# Patient Record
Sex: Female | Born: 1950 | Race: White | Hispanic: No | Marital: Married | Smoking: Former smoker
Health system: Southern US, Community
[De-identification: ages and names within clinical notes are randomized; demographics above are authoritative.]

## PROBLEM LIST (undated history)

## (undated) DIAGNOSIS — R51 Headache: Secondary | ICD-10-CM

## (undated) DIAGNOSIS — M5136 Other intervertebral disc degeneration, lumbar region: Secondary | ICD-10-CM

## (undated) DIAGNOSIS — Z8711 Personal history of peptic ulcer disease: Secondary | ICD-10-CM

## (undated) DIAGNOSIS — T4145XA Adverse effect of unspecified anesthetic, initial encounter: Secondary | ICD-10-CM

## (undated) DIAGNOSIS — B029 Zoster without complications: Secondary | ICD-10-CM

## (undated) DIAGNOSIS — J45909 Unspecified asthma, uncomplicated: Secondary | ICD-10-CM

## (undated) DIAGNOSIS — Z8719 Personal history of other diseases of the digestive system: Secondary | ICD-10-CM

## (undated) DIAGNOSIS — I8393 Asymptomatic varicose veins of bilateral lower extremities: Secondary | ICD-10-CM

## (undated) DIAGNOSIS — Z87442 Personal history of urinary calculi: Secondary | ICD-10-CM

## (undated) DIAGNOSIS — T8859XA Other complications of anesthesia, initial encounter: Secondary | ICD-10-CM

## (undated) DIAGNOSIS — Z9889 Other specified postprocedural states: Secondary | ICD-10-CM

## (undated) DIAGNOSIS — M51369 Other intervertebral disc degeneration, lumbar region without mention of lumbar back pain or lower extremity pain: Secondary | ICD-10-CM

## (undated) DIAGNOSIS — R519 Headache, unspecified: Secondary | ICD-10-CM

## (undated) DIAGNOSIS — M069 Rheumatoid arthritis, unspecified: Secondary | ICD-10-CM

## (undated) DIAGNOSIS — Z973 Presence of spectacles and contact lenses: Secondary | ICD-10-CM

## (undated) DIAGNOSIS — M25461 Effusion, right knee: Secondary | ICD-10-CM

## (undated) DIAGNOSIS — M797 Fibromyalgia: Secondary | ICD-10-CM

## (undated) DIAGNOSIS — K219 Gastro-esophageal reflux disease without esophagitis: Secondary | ICD-10-CM

## (undated) DIAGNOSIS — K5730 Diverticulosis of large intestine without perforation or abscess without bleeding: Secondary | ICD-10-CM

## (undated) DIAGNOSIS — M1990 Unspecified osteoarthritis, unspecified site: Secondary | ICD-10-CM

## (undated) DIAGNOSIS — S83207A Unspecified tear of unspecified meniscus, current injury, left knee, initial encounter: Secondary | ICD-10-CM

## (undated) DIAGNOSIS — I10 Essential (primary) hypertension: Secondary | ICD-10-CM

## (undated) DIAGNOSIS — B059 Measles without complication: Secondary | ICD-10-CM

## (undated) DIAGNOSIS — R112 Nausea with vomiting, unspecified: Secondary | ICD-10-CM

## (undated) DIAGNOSIS — B269 Mumps without complication: Secondary | ICD-10-CM

## (undated) HISTORY — PX: SHOULDER OPEN ROTATOR CUFF REPAIR: SHX2407

## (undated) HISTORY — PX: TARSAL TUNNEL RELEASE: SUR1099

## (undated) HISTORY — PX: MENISCUS REPAIR: SHX5179

---

## 1974-06-28 HISTORY — PX: APPENDECTOMY: SHX54

## 1981-06-28 HISTORY — PX: VAGINAL HYSTERECTOMY: SUR661

## 1999-06-26 ENCOUNTER — Ambulatory Visit (HOSPITAL_COMMUNITY): Admission: RE | Admit: 1999-06-26 | Discharge: 1999-06-26 | Payer: Self-pay | Admitting: *Deleted

## 1999-06-26 ENCOUNTER — Encounter: Payer: Self-pay | Admitting: *Deleted

## 1999-07-17 ENCOUNTER — Ambulatory Visit (HOSPITAL_COMMUNITY): Admission: RE | Admit: 1999-07-17 | Discharge: 1999-07-17 | Payer: Self-pay | Admitting: *Deleted

## 1999-10-12 ENCOUNTER — Ambulatory Visit (HOSPITAL_COMMUNITY): Admission: RE | Admit: 1999-10-12 | Discharge: 1999-10-12 | Payer: Self-pay | Admitting: Orthopedic Surgery

## 1999-10-12 ENCOUNTER — Encounter: Payer: Self-pay | Admitting: Orthopedic Surgery

## 1999-10-23 ENCOUNTER — Encounter: Payer: Self-pay | Admitting: Orthopedic Surgery

## 1999-10-23 ENCOUNTER — Ambulatory Visit (HOSPITAL_COMMUNITY): Admission: RE | Admit: 1999-10-23 | Discharge: 1999-10-23 | Payer: Self-pay | Admitting: Orthopedic Surgery

## 1999-11-06 ENCOUNTER — Encounter: Payer: Self-pay | Admitting: Orthopedic Surgery

## 1999-11-06 ENCOUNTER — Ambulatory Visit (HOSPITAL_COMMUNITY): Admission: RE | Admit: 1999-11-06 | Discharge: 1999-11-06 | Payer: Self-pay | Admitting: Orthopedic Surgery

## 2003-10-28 ENCOUNTER — Encounter: Admission: RE | Admit: 2003-10-28 | Discharge: 2003-12-09 | Payer: Self-pay | Admitting: Family Medicine

## 2009-11-05 ENCOUNTER — Encounter: Admission: RE | Admit: 2009-11-05 | Discharge: 2009-11-05 | Payer: Self-pay | Admitting: Family Medicine

## 2009-11-06 ENCOUNTER — Encounter: Admission: RE | Admit: 2009-11-06 | Discharge: 2010-02-04 | Payer: Self-pay | Admitting: Podiatry

## 2010-02-11 ENCOUNTER — Encounter: Admission: RE | Admit: 2010-02-11 | Discharge: 2010-02-11 | Payer: Self-pay | Admitting: Neurosurgery

## 2010-07-19 ENCOUNTER — Encounter: Payer: Self-pay | Admitting: Family Medicine

## 2011-03-29 ENCOUNTER — Other Ambulatory Visit: Payer: Self-pay | Admitting: Family Medicine

## 2011-03-29 DIAGNOSIS — Z1231 Encounter for screening mammogram for malignant neoplasm of breast: Secondary | ICD-10-CM

## 2011-04-30 ENCOUNTER — Ambulatory Visit: Payer: Self-pay

## 2013-07-12 ENCOUNTER — Other Ambulatory Visit: Payer: Self-pay | Admitting: Rheumatology

## 2013-07-12 DIAGNOSIS — M25561 Pain in right knee: Secondary | ICD-10-CM

## 2013-07-14 ENCOUNTER — Ambulatory Visit
Admission: RE | Admit: 2013-07-14 | Discharge: 2013-07-14 | Disposition: A | Discharge status: Home / Self Care | Payer: BC Managed Care – PPO | Source: Ambulatory Visit | Attending: Rheumatology | Admitting: Rheumatology

## 2013-07-14 DIAGNOSIS — M25561 Pain in right knee: Secondary | ICD-10-CM

## 2013-07-16 ENCOUNTER — Ambulatory Visit
Admission: RE | Admit: 2013-07-16 | Discharge: 2013-07-16 | Disposition: A | Discharge status: Home / Self Care | Payer: BC Managed Care – PPO | Source: Ambulatory Visit | Attending: Family Medicine | Admitting: Family Medicine

## 2013-07-16 ENCOUNTER — Other Ambulatory Visit: Payer: Self-pay | Admitting: Family Medicine

## 2013-07-16 DIAGNOSIS — N200 Calculus of kidney: Secondary | ICD-10-CM

## 2013-07-16 DIAGNOSIS — M545 Low back pain, unspecified: Secondary | ICD-10-CM

## 2013-08-31 ENCOUNTER — Other Ambulatory Visit: Payer: Self-pay | Admitting: Orthopedic Surgery

## 2013-08-31 NOTE — H&P (Signed)
[name] is an 63 y.o. female.   Chief Complaint: R knee pain HPI: The patient reports right knee symptoms including: pain and giving way (popping and grinding) which began 1 year(s) ago without any known injury. The patient describes the severity of the symptoms as moderate in severity.The patient feels that the symptoms are unchanging. Prior to being seen today the patient was previously evaluated by a primary care provider (Dr. Nickola Wells). Previous work-up for this problem has included knee MRI. Past treatment for this problem has included application of heat, intra-articular injection of corticosteroids (few days of relief), nonsteroidal anti-inflammatory drugs (Voltaren Gel), non-opioid analgesics (Tramadol) and opioid analgesics (Hydrocodone). Symptoms are reported to be located in the right knee and include knee pain, swelling, locking and instability. The patient does not report any radiation of symptoms. The patient describes their pain as dull and aching. Onset of symptoms was gradual. Symptoms are exacerbated by motion at the knee (flexion), weight bearing, kneeling and squatting. Pertinent medical history includes rheumatoid arthritis. [name] presents with R knee pain, over a year duration, no injury. Her left knee is also slightly painful but not as severe and not with mechanical symptoms. She had relief with a steroid injection in the left knee but in the right it only helped a few days. She reports continued swelling, instability, catching and locking in the right knee. She also had an IM steroid injection with no relief. All the above medications give only temporary relief. She sometimes wakes up with pain at night when turning over in bed.   No past medical history on file.  No past surgical history on file.  No family history on file. Social History:  has no tobacco, alcohol, and drug history on file.  Allergies: Allergies not on file   (Not in a hospital admission)  No results  found for this or any previous visit (from the past 48 hour(s)). No results found.  Review of Systems  Constitutional: Negative.   HENT: Negative.   Eyes: Negative.   Respiratory: Negative.   Cardiovascular: Negative.   Genitourinary: Negative.   Musculoskeletal: Positive for back pain and joint pain.  Skin: Negative.   Neurological: Positive for sensory change and focal weakness.  Psychiatric/Behavioral: Negative.     There were no vitals taken for this visit. Physical Exam  Constitutional: She is oriented to person, place, and time. She appears well-developed and well-nourished.  HENT:  Head: Normocephalic and atraumatic.  Eyes: Conjunctivae and EOM are normal. Pupils are equal, round, and reactive to light.  Neck: Normal range of motion. Neck supple.  Cardiovascular: Normal rate and regular rhythm.   Respiratory: Effort normal and breath sounds normal.  GI: Soft. Bowel sounds are normal.  Musculoskeletal:  General Mental Status - Alert. General Appearance- pleasant and In acute distress (mild). appears uncomfortable (seated, leaning right). Orientation- Oriented X3. Build & Nutrition- Well nourished and Well developed. Gait- Antalgic.  Peripheral Vascular Lower Extremity: Palpation:Homan's sign- Bilateral- Negative (normal). Posterior tibial pulse- Bilateral- 2+. Dorsalis pedis pulse- Bilateral- 2+.  Musculoskeletal Right Knee: Inspection and Palpation:Tenderness- medial joint line tender to palpation and lateral joint line tender to palpation. no tenderness to palpation of the posterior knee, no tenderness to palpation of the quadriceps tendon, no tenderness to palpation of the patellar tendon, no tenderness to palpation of the fibular head and no tenderness to palpation of the peroneal nerve. Patellar Tendon- no pain to palpation of the patellar tendon. Swelling- periarticular swelling present. Effusion- trace. Tissue tension/texture  is - soft. Crepitus-  mild patellofemoral crepitus. Pulses- 2+. Sensation- intact to light touch. Skin- Color- no ecchymosis and no erythema. Strength and Tone:Quadriceps- 5/5. Hamstrings- 5/5. ROM:- Full ROM of the knee. Stability- Valgus Laxity at 30- None. Valgus Laxity at 0- None. Varus Laxity at 30- None. Varus Laxity at 0- None. Lachman- Negative. Anterior Drawer Test - Negative. Posterior Drawer Test- Negative. Deformities/Malalignments/Discrepancies- no deformities noted. Special Tests:McMurray Test (lateral) - painful. McMurray Test (medial)- negative. Patellar Compression Pain- no pain.  Lymphatic General Lymphatics Description- No Localized lymphadenopathy.  Neurological: She is alert and oriented to person, place, and time. She has normal reflexes.  Skin: Skin is warm and dry.  Psychiatric: She has a normal mood and affect.    Knees with moderate medial joint space narrowing bilaterally. There is no collapse of the joint space.  R knee images and report from [facility] reviewed today by Dr. Shelle Iron with LMT at the junction of the anterior horn and body. Mild tricompartmental DJD. No OCD lesion or full thickness chondral lesions. Moderate effusion, possible loose body.  Assessment/Plan R knee LMT, DJD  Pt presents today with R knee pain, effusion, mechanical symptoms x 1 year without injury, refractory to steroid injections, PO and IM steroids, HEP, activity modifications, NSAIDs, pain medications, with LMT noted on MRI, underlying arthrosis without collapse of the joint space. We discussed relevant anatomy. Given her ongoing pain and mechanical symptoms which are refractory to above listed conservative tx, recommend arthroscopy right knee with partial lateral meniscectomy and debridement. We discussed the procedure itself as well as risks, complications and alternatives including but not limited to DVT, PE, infx, bleeding, failure of procedure, need for secondary  procedure, anesthesia risk, even death. Discussed typical post-op protocols. Discussed possible progression of arthritic symptoms and possible need for repeat steroid injections or even viscosupplementation. All her questions were answered and she desires to proceed. Will obtain pre-op clearance and proceed accordingly. In the interim, recommend activity modifications, quad strengthening. Will follow up for the knee 10-14 days post-op for suture removal.  I had a long discussion with the patient concerning the risks and benefits of knee arthroscopy including help from the arthroscopic procedure as well as no help from the arthroscopic procedure or worsening of symptoms. Also discussed infection, DVT, PE, anesthetic complications, etc. Also discussed the possibility of repeat arthroscopic surgery required in the future or total knee replacement. I provided the patient with an illustrated handout and discussed it in detail as well as discussed the postoperative and perioperative courses and return to functional activities including work. Need for postoperative DVT prophylaxis was discussed as well.  Plan right knee arthroscopy, partial lateral meniscectomy, debridement  Mertice Uffelman M. for Dr. Shelle Iron 08/31/2013, 3:17 PM

## 2013-09-04 ENCOUNTER — Encounter (HOSPITAL_BASED_OUTPATIENT_CLINIC_OR_DEPARTMENT_OTHER): Payer: Self-pay | Admitting: *Deleted

## 2013-09-05 ENCOUNTER — Encounter (HOSPITAL_BASED_OUTPATIENT_CLINIC_OR_DEPARTMENT_OTHER): Payer: Self-pay | Admitting: *Deleted

## 2013-09-05 NOTE — Progress Notes (Addendum)
NPO AFTER MN WITH EXCEPTION CLEAR LIQUIDS UNTIL 0800 (NO CREAM/ MILK PRODUCTS).  ARRIVE AT 1215. NEEDS ISTAT AND EKG. WILL TAKE NORVASC, TYLENOL, FOLIC ACID, PREDNISONE, AND  PLAQUENIL AM DOS W/ SIPS OF WATER.

## 2013-09-07 ENCOUNTER — Ambulatory Visit (HOSPITAL_BASED_OUTPATIENT_CLINIC_OR_DEPARTMENT_OTHER)
Admission: RE | Admit: 2013-09-07 | Discharge: 2013-09-07 | Disposition: A | Discharge status: Home / Self Care | Payer: BC Managed Care – PPO | Source: Ambulatory Visit | Attending: Specialist | Admitting: Specialist

## 2013-09-07 ENCOUNTER — Encounter (HOSPITAL_BASED_OUTPATIENT_CLINIC_OR_DEPARTMENT_OTHER): Payer: Self-pay

## 2013-09-07 ENCOUNTER — Encounter (HOSPITAL_BASED_OUTPATIENT_CLINIC_OR_DEPARTMENT_OTHER)
Admission: RE | Disposition: A | Discharge status: Home / Self Care | Payer: Self-pay | Source: Ambulatory Visit | Attending: Specialist

## 2013-09-07 ENCOUNTER — Ambulatory Visit (HOSPITAL_BASED_OUTPATIENT_CLINIC_OR_DEPARTMENT_OTHER): Payer: BC Managed Care – PPO | Admitting: Anesthesiology

## 2013-09-07 ENCOUNTER — Encounter (HOSPITAL_BASED_OUTPATIENT_CLINIC_OR_DEPARTMENT_OTHER): Payer: BC Managed Care – PPO | Admitting: Anesthesiology

## 2013-09-07 DIAGNOSIS — S83289A Other tear of lateral meniscus, current injury, unspecified knee, initial encounter: Secondary | ICD-10-CM | POA: Insufficient documentation

## 2013-09-07 DIAGNOSIS — M1710 Unilateral primary osteoarthritis, unspecified knee: Secondary | ICD-10-CM | POA: Insufficient documentation

## 2013-09-07 DIAGNOSIS — S83249A Other tear of medial meniscus, current injury, unspecified knee, initial encounter: Secondary | ICD-10-CM

## 2013-09-07 DIAGNOSIS — I4510 Unspecified right bundle-branch block: Secondary | ICD-10-CM | POA: Insufficient documentation

## 2013-09-07 DIAGNOSIS — M069 Rheumatoid arthritis, unspecified: Secondary | ICD-10-CM | POA: Insufficient documentation

## 2013-09-07 DIAGNOSIS — M2240 Chondromalacia patellae, unspecified knee: Secondary | ICD-10-CM | POA: Insufficient documentation

## 2013-09-07 DIAGNOSIS — Z87891 Personal history of nicotine dependence: Secondary | ICD-10-CM | POA: Insufficient documentation

## 2013-09-07 DIAGNOSIS — I10 Essential (primary) hypertension: Secondary | ICD-10-CM | POA: Insufficient documentation

## 2013-09-07 DIAGNOSIS — J45909 Unspecified asthma, uncomplicated: Secondary | ICD-10-CM | POA: Insufficient documentation

## 2013-09-07 DIAGNOSIS — M25469 Effusion, unspecified knee: Secondary | ICD-10-CM | POA: Insufficient documentation

## 2013-09-07 DIAGNOSIS — IMO0002 Reserved for concepts with insufficient information to code with codable children: Secondary | ICD-10-CM | POA: Insufficient documentation

## 2013-09-07 DIAGNOSIS — X58XXXA Exposure to other specified factors, initial encounter: Secondary | ICD-10-CM | POA: Insufficient documentation

## 2013-09-07 HISTORY — DX: Personal history of peptic ulcer disease: Z87.11

## 2013-09-07 HISTORY — DX: Unspecified osteoarthritis, unspecified site: M19.90

## 2013-09-07 HISTORY — DX: Rheumatoid arthritis, unspecified: M06.9

## 2013-09-07 HISTORY — DX: Essential (primary) hypertension: I10

## 2013-09-07 HISTORY — DX: Presence of spectacles and contact lenses: Z97.3

## 2013-09-07 HISTORY — DX: Personal history of other diseases of the digestive system: Z87.19

## 2013-09-07 HISTORY — PX: KNEE ARTHROSCOPY WITH LATERAL MENISECTOMY: SHX6193

## 2013-09-07 HISTORY — DX: Diverticulosis of large intestine without perforation or abscess without bleeding: K57.30

## 2013-09-07 HISTORY — DX: Unspecified asthma, uncomplicated: J45.909

## 2013-09-07 LAB — POCT I-STAT 4, (NA,K, GLUC, HGB,HCT)
Glucose, Bld: 81 mg/dL (ref 70–99)
HCT: 46 % (ref 36.0–46.0)
Hemoglobin: 15.6 g/dL — ABNORMAL HIGH (ref 12.0–15.0)
Potassium: 2.7 meq/L — CL (ref 3.7–5.3)
Sodium: 137 meq/L (ref 137–147)

## 2013-09-07 SURGERY — ARTHROSCOPY, KNEE, WITH LATERAL MENISCECTOMY
Anesthesia: General | Site: Knee | Laterality: Right

## 2013-09-07 MED ORDER — EPINEPHRINE HCL 1 MG/ML IJ SOLN
INTRAMUSCULAR | Status: DC | PRN
  Administered 2013-09-07: 14:00:00

## 2013-09-07 MED ORDER — CEFAZOLIN SODIUM-DEXTROSE 2-3 GM-% IV SOLR
2.0000 g | INTRAVENOUS | Status: AC
  Administered 2013-09-07: 2 g via INTRAVENOUS
  Filled 2013-09-07: qty 50

## 2013-09-07 MED ORDER — SODIUM CHLORIDE 0.9 % IR SOLN
Status: DC | PRN
  Administered 2013-09-07: 14:00:00

## 2013-09-07 MED ORDER — FENTANYL CITRATE 0.05 MG/ML IJ SOLN
25.0000 ug | INTRAMUSCULAR | Status: DC | PRN
  Administered 2013-09-07: 25 ug via INTRAVENOUS
  Filled 2013-09-07: qty 1

## 2013-09-07 MED ORDER — PROPOFOL 10 MG/ML IV BOLUS
INTRAVENOUS | Status: DC | PRN
  Administered 2013-09-07: 160 mg via INTRAVENOUS

## 2013-09-07 MED ORDER — LACTATED RINGERS IV SOLN
INTRAVENOUS | Status: DC
  Administered 2013-09-07 (×2): via INTRAVENOUS
  Filled 2013-09-07: qty 1000

## 2013-09-07 MED ORDER — KETOROLAC TROMETHAMINE 30 MG/ML IJ SOLN
INTRAMUSCULAR | Status: DC | PRN
  Administered 2013-09-07: 15 mg via INTRAVENOUS

## 2013-09-07 MED ORDER — FENTANYL CITRATE 0.05 MG/ML IJ SOLN
INTRAMUSCULAR | Status: AC
  Filled 2013-09-07: qty 4

## 2013-09-07 MED ORDER — ONDANSETRON HCL 4 MG/2ML IJ SOLN
INTRAMUSCULAR | Status: DC | PRN
  Administered 2013-09-07: 4 mg via INTRAVENOUS

## 2013-09-07 MED ORDER — LIDOCAINE HCL (CARDIAC) 20 MG/ML IV SOLN
INTRAVENOUS | Status: DC | PRN
  Administered 2013-09-07: 60 mg via INTRAVENOUS

## 2013-09-07 MED ORDER — MIDAZOLAM HCL 5 MG/5ML IJ SOLN
INTRAMUSCULAR | Status: DC | PRN
  Administered 2013-09-07: 2 mg via INTRAVENOUS

## 2013-09-07 MED ORDER — LACTATED RINGERS IV SOLN
INTRAVENOUS | Status: DC
  Filled 2013-09-07: qty 1000

## 2013-09-07 MED ORDER — FENTANYL CITRATE 0.05 MG/ML IJ SOLN
INTRAMUSCULAR | Status: AC
  Filled 2013-09-07: qty 2

## 2013-09-07 MED ORDER — DEXAMETHASONE SODIUM PHOSPHATE 4 MG/ML IJ SOLN
INTRAMUSCULAR | Status: DC | PRN
  Administered 2013-09-07: 10 mg via INTRAVENOUS

## 2013-09-07 MED ORDER — KETOROLAC TROMETHAMINE 10 MG PO TABS: 10.0000 mg | ORAL_TABLET | Freq: Four times a day (QID) | ORAL | Status: DC | PRN

## 2013-09-07 MED ORDER — MIDAZOLAM HCL 2 MG/2ML IJ SOLN
INTRAMUSCULAR | Status: AC
  Filled 2013-09-07: qty 2

## 2013-09-07 MED ORDER — TRIAMCINOLONE ACETONIDE 40 MG/ML IJ SUSP
INTRAMUSCULAR | Status: DC | PRN
  Administered 2013-09-07: 40 mg via INTRAMUSCULAR

## 2013-09-07 MED ORDER — BUPIVACAINE-EPINEPHRINE 0.5% -1:200000 IJ SOLN
INTRAMUSCULAR | Status: DC | PRN
  Administered 2013-09-07: 22 mL

## 2013-09-07 MED ORDER — FENTANYL CITRATE 0.05 MG/ML IJ SOLN
INTRAMUSCULAR | Status: DC | PRN
  Administered 2013-09-07 (×2): 25 ug via INTRAVENOUS
  Administered 2013-09-07: 50 ug via INTRAVENOUS

## 2013-09-07 SURGICAL SUPPLY — 51 items
BANDAGE ELASTIC 6 VELCRO ST LF (GAUZE/BANDAGES/DRESSINGS) ×3 IMPLANT
BLADE 4.2CUDA (BLADE) IMPLANT
BLADE CUDA SHAVER 3.5 (BLADE) ×3 IMPLANT
BLADE GREAT WHITE 4.2 (BLADE) IMPLANT
BLADE GREAT WHITE 4.2MM (BLADE)
BNDG COHESIVE 6X5 TAN NS LF (GAUZE/BANDAGES/DRESSINGS) ×3 IMPLANT
BOOTIES KNEE HIGH SLOAN (MISCELLANEOUS) ×3 IMPLANT
CANISTER SUCT LVC 12 LTR MEDI- (MISCELLANEOUS) ×3 IMPLANT
CANISTER SUCTION 1200CC (MISCELLANEOUS) IMPLANT
CANISTER SUCTION 2500CC (MISCELLANEOUS) IMPLANT
CANNULA ACUFLEX KIT 5X76 (CANNULA) IMPLANT
CLOTH BEACON ORANGE TIMEOUT ST (SAFETY) ×3 IMPLANT
CUTTER MENISCUS  4.2MM (BLADE)
CUTTER MENISCUS 4.2MM (BLADE) IMPLANT
DRAPE ARTHROSCOPY W/POUCH 114 (DRAPES) ×3 IMPLANT
DRSG EMULSION OIL 3X3 NADH (GAUZE/BANDAGES/DRESSINGS) ×3 IMPLANT
DURAPREP 26ML APPLICATOR (WOUND CARE) ×3 IMPLANT
ELECT REM PT RETURN 9FT ADLT (ELECTROSURGICAL)
ELECTRODE REM PT RTRN 9FT ADLT (ELECTROSURGICAL) IMPLANT
GLOVE BIOGEL M 6.5 STRL (GLOVE) ×3 IMPLANT
GLOVE BIOGEL PI IND STRL 6.5 (GLOVE) ×1 IMPLANT
GLOVE BIOGEL PI IND STRL 7.5 (GLOVE) ×1 IMPLANT
GLOVE BIOGEL PI INDICATOR 6.5 (GLOVE) ×2
GLOVE BIOGEL PI INDICATOR 7.5 (GLOVE) ×2
GLOVE SURG SS PI 8.0 STRL IVOR (GLOVE) ×3 IMPLANT
GOWN PREVENTION PLUS LG XLONG (DISPOSABLE) ×3 IMPLANT
GOWN STRL REIN XL XLG (GOWN DISPOSABLE) ×3 IMPLANT
GOWN STRL REUS W/TWL LRG LVL3 (GOWN DISPOSABLE) ×3 IMPLANT
GOWN STRL REUS W/TWL XL LVL3 (GOWN DISPOSABLE) ×3 IMPLANT
IV NS IRRIG 3000ML ARTHROMATIC (IV SOLUTION) ×6 IMPLANT
KNEE WRAP E Z 3 GEL PACK (MISCELLANEOUS) ×3 IMPLANT
MINI VAC (SURGICAL WAND) IMPLANT
NDL SAFETY ECLIPSE 18X1.5 (NEEDLE) ×1 IMPLANT
NEEDLE FILTER BLUNT 18X 1/2SAF (NEEDLE) ×2
NEEDLE FILTER BLUNT 18X1 1/2 (NEEDLE) ×1 IMPLANT
NEEDLE HYPO 18GX1.5 SHARP (NEEDLE) ×2
PACK ARTHROSCOPY DSU (CUSTOM PROCEDURE TRAY) ×3 IMPLANT
PACK BASIN DAY SURGERY FS (CUSTOM PROCEDURE TRAY) ×3 IMPLANT
PAD ABD 8X10 STRL (GAUZE/BANDAGES/DRESSINGS) ×3 IMPLANT
PADDING CAST COTTON 6X4 STRL (CAST SUPPLIES) ×3 IMPLANT
RESECTOR FULL RADIUS 4.2MM (BLADE) IMPLANT
SET ARTHROSCOPY TUBING (MISCELLANEOUS) ×2
SET ARTHROSCOPY TUBING LN (MISCELLANEOUS) ×1 IMPLANT
SPONGE GAUZE 4X4 12PLY (GAUZE/BANDAGES/DRESSINGS) ×3 IMPLANT
SPONGE GAUZE 4X4 12PLY STER LF (GAUZE/BANDAGES/DRESSINGS) ×3 IMPLANT
SUT ETHILON 4 0 PS 2 18 (SUTURE) ×3 IMPLANT
SYR 30ML LL (SYRINGE) ×3 IMPLANT
SYRINGE 10CC LL (SYRINGE) ×3 IMPLANT
TOWEL OR 17X24 6PK STRL BLUE (TOWEL DISPOSABLE) ×3 IMPLANT
WAND 90 DEG TURBOVAC W/CORD (SURGICAL WAND) IMPLANT
WATER STERILE IRR 500ML POUR (IV SOLUTION) ×3 IMPLANT

## 2013-09-07 NOTE — Transfer of Care (Signed)
Immediate Anesthesia Transfer of Care Note  Patient: [name]  Procedure(s) Performed: Procedure(s) (LRB): RIGHT KNEE ARTHROSCOPY WITH PARTIAL MEDIAL AND LATERAL MENISECTOMY/DEBRIDEMENT/  (Right)  Patient Location: PACU  Anesthesia Type: General  Level of Consciousness: awake, oriented, sedated and patient cooperative  Airway & Oxygen Therapy: Patient Spontanous Breathing and Patient connected to face mask oxygen  Post-op Assessment: Report given to PACU RN and Post -op Vital signs reviewed and stable  Post vital signs: Reviewed and stable  Complications: No apparent anesthesia complications

## 2013-09-07 NOTE — Anesthesia Postprocedure Evaluation (Signed)
  Anesthesia Post-op Note  Patient: [name]  Procedure(s) Performed: Procedure(s) (LRB): RIGHT KNEE ARTHROSCOPY WITH PARTIAL MEDIAL AND LATERAL MENISECTOMY/DEBRIDEMENT/  (Right)  Patient Location: PACU  Anesthesia Type: General  Level of Consciousness: awake and alert   Airway and Oxygen Therapy: Patient Spontanous Breathing  Post-op Pain: mild  Post-op Assessment: Post-op Vital signs reviewed, Patient's Cardiovascular Status Stable, Respiratory Function Stable, Patent Airway and No signs of Nausea or vomiting  Last Vitals:  Filed Vitals:   09/07/13 1202  BP: 151/89  Pulse: 85  Temp: 36.4 C  Resp: 16    Post-op Vital Signs: stable   Complications: No apparent anesthesia complications

## 2013-09-07 NOTE — Interval H&P Note (Signed)
History and Physical Interval Note:  09/07/2013 12:41 PM  [name]  has presented today for surgery, with the diagnosis of RIGHT KNEE LATERAL MENISCUS TEAR/DJD  The various methods of treatment have been discussed with the patient and family. After consideration of risks, benefits and other options for treatment, the patient has consented to  Procedure(s): RIGHT KNEE ARTHROSCOPY WITH PARTIAL LATERAL MENISECTOMY/DEBRIDEMENT (Right) as a surgical intervention .  The patient's history has been reviewed, patient examined, no change in status, stable for surgery.  I have reviewed the patient's chart and labs.  Questions were answered to the patient's satisfaction.     [name]

## 2013-09-07 NOTE — Discharge Instructions (Signed)
ARTHROSCOPIC KNEE SURGERY HOME CARE INSTRUCTIONS   PAIN You will be expected to have a moderate amount of pain in the affected knee for approximately two weeks.  However, the first two to four days will be the most severe in terms of the pain you will experience.  Prescriptions have been provided for you to take as needed for the pain.  The pain can be markedly reduced by using the ice/compressive bandage given.  Exchange the ice packs whenever they thaw.  During the night, keep the bandage on because it will still provide some compression for the swelling.  Also, keep the leg elevated on pillows above your heart, and this will help alleviate the pain and swelling.  MEDICATION Prescriptions have been provided to take as needed for pain. To prevent blood clots, take Aspirin 325mg  daily with a meal if not on a blood thinner and if no history of stomach ulcers.  ACTIVITY It is preferred that you stay on bedrest for approximately 24 hours.  However, you may go to the bathroom with help.  After this, you can start to be up and about progressively more.  Remember that the swelling may still increase after three to four days if you are up and doing too much.  You may put as much weight on the affected leg as pain will allow.  Use your crutches for comfort and safety.  However, as soon as you are able, you may discard the crutches and go without them.   DRESSING Keep the current dressing as dry as possible.  Two days after your surgery, you may remove the ice/compressive wrap, and surgical dressing.  You may now take a shower, but do not scrub the sounds directly with soap.  Let water rinse over these and gently wipe with your hand.  Reapply band-aids over the puncture wounds and more gauze if needed.  A slight amount of thin drainage can be normal at this time, and do not let it frighten you.  Reapply the ice/compressive wrap.  You may now repeat this every day each time you shower.  SYMPTOMS TO REPORT TO  YOUR DOCTOR  -Extreme pain.  -Extreme swelling.  -Temperature above 101 degrees that does not come down with acetaminophen     (Tylenol).  -Any changes in the feeling, color or movement of your toes.  -Extreme redness, heat, swelling or drainage at your incision  EXERCISE It is preferred that you begin to exercise on the day of your surgery.  Straight leg raises and short arc quads should be begun the afternoon or evening of surgery and continued until you come back for your follow-up appointment.   Attached is an instruction sheet on how to perform these two simple exercises.  Do these at least three times per day if not more.  You may bend your knee as much as is comfortable.  The puncture wounds may occasionally be slightly uncomfortable with bending of the knee.  Do not let this frighten you.  It is important to keep your knee motion, but do not overdo it.  If you have significant pain, simply do not bend the knee as far.   You will be given more exercises to perform at your first return visit.    RETURN APPOINTMENT Please make an appointment to be seen by your doctor in 10-14 days from your surgery.  Patient Signature:  ________________________________________________________  Nurse's Signature:  ________________________________________________________   Post Anesthesia Home Care Instructions  Activity: Get plenty of  rest for the remainder of the day. A responsible adult should stay with you for 24 hours following the procedure.  For the next 24 hours, DO NOT: -Drive a car -Advertising copywriter -Drink alcoholic beverages -Take any medication unless instructed by your physician -Make any legal decisions or sign important papers.  Meals: Start with liquid foods such as gelatin or soup. Progress to regular foods as tolerated. Avoid greasy, spicy, heavy foods. If nausea and/or vomiting occur, drink only clear liquids until the nausea and/or vomiting subsides. Call your physician if  vomiting continues.  Special Instructions/Symptoms: Your throat may feel dry or sore from the anesthesia or the breathing tube placed in your throat during surgery. If this causes discomfort, gargle with warm salt water. The discomfort should disappear within 24 hours.  Post Anesthesia Home Care Instructions  Activity: Get plenty of rest for the remainder of the day. A responsible adult should stay with you for 24 hours following the procedure.  For the next 24 hours, DO NOT: -Drive a car -Advertising copywriter -Drink alcoholic beverages -Take any medication unless instructed by your physician -Make any legal decisions or sign important papers.  Meals: Start with liquid foods such as gelatin or soup. Progress to regular foods as tolerated. Avoid greasy, spicy, heavy foods. If nausea and/or vomiting occur, drink only clear liquids until the nausea and/or vomiting subsides. Call your physician if vomiting continues.  Special Instructions/Symptoms: Your throat may feel dry or sore from the anesthesia or the breathing tube placed in your throat during surgery. If this causes discomfort, gargle with warm salt water. The discomfort should disappear within 24 hours.

## 2013-09-07 NOTE — Brief Op Note (Signed)
09/07/2013  1:39 PM  PATIENT:  [name]  63 y.o. female  PRE-OPERATIVE DIAGNOSIS:  RIGHT KNEE LATERAL MENISCUS TEAR/DJD  POST-OPERATIVE DIAGNOSIS:  RIGHT KNEE LATERAL MENISCUS TEAR/DJD  PROCEDURE:  Procedure(s): RIGHT KNEE ARTHROSCOPY WITH PARTIAL LATERAL MENISECTOMY/DEBRIDEMENT (Right)  SURGEON:  Surgeon(s) and Role:    * Javier Docker, MD - Primary  PHYSICIAN ASSISTANT:   ASSISTANTS: none   ANESTHESIA:   general  EBL:     BLOOD ADMINISTERED:none  DRAINS: none   LOCAL MEDICATIONS USED:  MARCAINE     SPECIMEN:  No Specimen  DISPOSITION OF SPECIMEN:  N/A  COUNTS:  YES  TOURNIQUET:  * No tourniquets in log *  DICTATION: .Other Dictation: Dictation Number [id number]  PLAN OF CARE: Discharge to home after PACU  PATIENT DISPOSITION:  PACU - hemodynamically stable.   Delay start of Pharmacological VTE agent (>24hrs) due to surgical blood loss or risk of bleeding: no

## 2013-09-07 NOTE — H&P (View-Only) (Signed)
[name] is an 63 y.o. female.   Chief Complaint: R knee pain HPI: The patient reports right knee symptoms including: pain and giving way (popping and grinding) which began 1 year(s) ago without any known injury. The patient describes the severity of the symptoms as moderate in severity.The patient feels that the symptoms are unchanging. Prior to being seen today the patient was previously evaluated by a primary care provider (Dr. Nickola Wells). Previous work-up for this problem has included knee MRI. Past treatment for this problem has included application of heat, intra-articular injection of corticosteroids (few days of relief), nonsteroidal anti-inflammatory drugs (Voltaren Gel), non-opioid analgesics (Tramadol) and opioid analgesics (Hydrocodone). Symptoms are reported to be located in the right knee and include knee pain, swelling, locking and instability. The patient does not report any radiation of symptoms. The patient describes their pain as dull and aching. Onset of symptoms was gradual. Symptoms are exacerbated by motion at the knee (flexion), weight bearing, kneeling and squatting. Pertinent medical history includes rheumatoid arthritis. [name] presents with R knee pain, over a year duration, no injury. Her left knee is also slightly painful but not as severe and not with mechanical symptoms. She had relief with a steroid injection in the left knee but in the right it only helped a few days. She reports continued swelling, instability, catching and locking in the right knee. She also had an IM steroid injection with no relief. All the above medications give only temporary relief. She sometimes wakes up with pain at night when turning over in bed.   No past medical history on file.  No past surgical history on file.  No family history on file. Social History:  has no tobacco, alcohol, and drug history on file.  Allergies: Allergies not on file   (Not in a hospital admission)  No results  found for this or any previous visit (from the past 48 hour(s)). No results found.  Review of Systems  Constitutional: Negative.   HENT: Negative.   Eyes: Negative.   Respiratory: Negative.   Cardiovascular: Negative.   Genitourinary: Negative.   Musculoskeletal: Positive for back pain and joint pain.  Skin: Negative.   Neurological: Positive for sensory change and focal weakness.  Psychiatric/Behavioral: Negative.     There were no vitals taken for this visit. Physical Exam  Constitutional: She is oriented to person, place, and time. She appears well-developed and well-nourished.  HENT:  Head: Normocephalic and atraumatic.  Eyes: Conjunctivae and EOM are normal. Pupils are equal, round, and reactive to light.  Neck: Normal range of motion. Neck supple.  Cardiovascular: Normal rate and regular rhythm.   Respiratory: Effort normal and breath sounds normal.  GI: Soft. Bowel sounds are normal.  Musculoskeletal:  General Mental Status - Alert. General Appearance- pleasant and In acute distress (mild). appears uncomfortable (seated, leaning right). Orientation- Oriented X3. Build & Nutrition- Well nourished and Well developed. Gait- Antalgic.  Peripheral Vascular Lower Extremity: Palpation:Homan's sign- Bilateral- Negative (normal). Posterior tibial pulse- Bilateral- 2+. Dorsalis pedis pulse- Bilateral- 2+.  Musculoskeletal Right Knee: Inspection and Palpation:Tenderness- medial joint line tender to palpation and lateral joint line tender to palpation. no tenderness to palpation of the posterior knee, no tenderness to palpation of the quadriceps tendon, no tenderness to palpation of the patellar tendon, no tenderness to palpation of the fibular head and no tenderness to palpation of the peroneal nerve. Patellar Tendon- no pain to palpation of the patellar tendon. Swelling- periarticular swelling present. Effusion- trace. Tissue tension/texture  is - soft. Crepitus-  mild patellofemoral crepitus. Pulses- 2+. Sensation- intact to light touch. Skin- Color- no ecchymosis and no erythema. Strength and Tone:Quadriceps- 5/5. Hamstrings- 5/5. ROM:- Full ROM of the knee. Stability- Valgus Laxity at 30- None. Valgus Laxity at 0- None. Varus Laxity at 30- None. Varus Laxity at 0- None. Lachman- Negative. Anterior Drawer Test - Negative. Posterior Drawer Test- Negative. Deformities/Malalignments/Discrepancies- no deformities noted. Special Tests:McMurray Test (lateral) - painful. McMurray Test (medial)- negative. Patellar Compression Pain- no pain.  Lymphatic General Lymphatics Description- No Localized lymphadenopathy.  Neurological: She is alert and oriented to person, place, and time. She has normal reflexes.  Skin: Skin is warm and dry.  Psychiatric: She has a normal mood and affect.    Knees with moderate medial joint space narrowing bilaterally. There is no collapse of the joint space.  R knee images and report from [facility] reviewed today by Dr. Beane with LMT at the junction of the anterior horn and body. Mild tricompartmental DJD. No OCD lesion or full thickness chondral lesions. Moderate effusion, possible loose body.  Assessment/Plan R knee LMT, DJD  Pt presents today with R knee pain, effusion, mechanical symptoms x 1 year without injury, refractory to steroid injections, PO and IM steroids, HEP, activity modifications, NSAIDs, pain medications, with LMT noted on MRI, underlying arthrosis without collapse of the joint space. We discussed relevant anatomy. Given her ongoing pain and mechanical symptoms which are refractory to above listed conservative tx, recommend arthroscopy right knee with partial lateral meniscectomy and debridement. We discussed the procedure itself as well as risks, complications and alternatives including but not limited to DVT, PE, infx, bleeding, failure of procedure, need for secondary  procedure, anesthesia risk, even death. Discussed typical post-op protocols. Discussed possible progression of arthritic symptoms and possible need for repeat steroid injections or even viscosupplementation. All her questions were answered and she desires to proceed. Will obtain pre-op clearance and proceed accordingly. In the interim, recommend activity modifications, quad strengthening. Will follow up for the knee 10-14 days post-op for suture removal.  I had a long discussion with the patient concerning the risks and benefits of knee arthroscopy including help from the arthroscopic procedure as well as no help from the arthroscopic procedure or worsening of symptoms. Also discussed infection, DVT, PE, anesthetic complications, etc. Also discussed the possibility of repeat arthroscopic surgery required in the future or total knee replacement. I provided the patient with an illustrated handout and discussed it in detail as well as discussed the postoperative and perioperative courses and return to functional activities including work. Need for postoperative DVT prophylaxis was discussed as well.  Plan right knee arthroscopy, partial lateral meniscectomy, debridement  BISSELL, JACLYN M. for Dr. Beane 08/31/2013, 3:17 PM    

## 2013-09-07 NOTE — Progress Notes (Signed)
Reported Potassium level to 2.7 to Dr. Leta Jungling, no new orders.

## 2013-09-07 NOTE — Anesthesia Procedure Notes (Signed)
Procedure Name: LMA Insertion Date/Time: 09/07/2013 1:54 PM Performed by: Renella Cunas D Pre-anesthesia Checklist: Patient identified, Emergency Drugs available, Suction available and Patient being monitored Patient Re-evaluated:Patient Re-evaluated prior to inductionOxygen Delivery Method: Circle System Utilized Preoxygenation: Pre-oxygenation with 100% oxygen Intubation Type: IV induction Ventilation: Mask ventilation without difficulty LMA: LMA inserted LMA Size: 4.0 Number of attempts: 1 Airway Equipment and Method: bite block Placement Confirmation: positive ETCO2 Tube secured with: Tape Dental Injury: Teeth and Oropharynx as per pre-operative assessment

## 2013-09-07 NOTE — Anesthesia Preprocedure Evaluation (Signed)
Anesthesia Evaluation  Patient identified by MRN, date of birth, ID band Patient awake    Reviewed: Allergy & Precautions, H&P , NPO status , Patient's Chart, lab work & pertinent test results, reviewed documented beta blocker date and time   Airway Mallampati: II TM Distance: >3 FB Neck ROM: full    Dental no notable dental hx. (+) Teeth Intact, Dental Advisory Given   Pulmonary asthma , former smoker,  Asthma due to environmental allergies breath sounds clear to auscultation  Pulmonary exam normal       Cardiovascular Exercise Tolerance: Good hypertension, Pt. on medications Rhythm:regular Rate:Normal  RBBB   Neuro/Psych negative neurological ROS  negative psych ROS   GI/Hepatic negative GI ROS, Neg liver ROS,   Endo/Other  negative endocrine ROS  Renal/GU negative Renal ROS  negative genitourinary   Musculoskeletal  (+) Arthritis -, Rheumatoid disorders,    Abdominal   Peds  Hematology negative hematology ROS (+)   Anesthesia Other Findings   Reproductive/Obstetrics negative OB ROS                           Anesthesia Physical Anesthesia Plan  ASA: III  Anesthesia Plan: General   Post-op Pain Management:    Induction: Intravenous  Airway Management Planned: LMA  Additional Equipment:   Intra-op Plan:   Post-operative Plan:   Informed Consent: I have reviewed the patients History and Physical, chart, labs and discussed the procedure including the risks, benefits and alternatives for the proposed anesthesia with the patient or authorized representative who has indicated his/her understanding and acceptance.   Dental Advisory Given  Plan Discussed with: CRNA and Surgeon  Anesthesia Plan Comments:         Anesthesia Quick Evaluation

## 2013-09-10 ENCOUNTER — Encounter (HOSPITAL_BASED_OUTPATIENT_CLINIC_OR_DEPARTMENT_OTHER): Payer: Self-pay | Admitting: Specialist

## 2013-09-10 NOTE — Op Note (Signed)
NAME:  [name], [name]                ACCOUNT NO.:  [id number]  MEDICAL RECORD NO.:  [id number]  LOCATION:                                 FACILITY:  PHYSICIAN:  Jene Every, M.D.    DATE OF BIRTH:  December 16, 1950  DATE OF PROCEDURE:  09/07/2013 DATE OF DISCHARGE:                              OPERATIVE REPORT   PREOPERATIVE DIAGNOSIS: 1. Lateral meniscus tear, degenerative joint disease. 2. Degenerative joint disease of the left knee.  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  PROCEDURE PERFORMED: 1. Right knee arthroscopy with partial, medial, and lateral     meniscectomies. 2. Extensive debridement of the chondroplasty of the patella, medial     femoral condyle, tibial plateau, and lateral femoral condyle. 3. Intra-articular corticosteroid injection of the left knee as well.  ANESTHESIA:  General.  ASSISTANT:  None.  BRIEF HISTORY:  This is a 63 year old with bilateral knee pain, right greater than left, locking, popping, and giving way.  MRI indicating lateral meniscus tear indicated for partial lateral meniscectomy.  DJD of the left knee, asked for an intra-articular corticosteroid injection to the left knee.  Risk and benefits discussed including bleeding, infection, damage to neurovascular structures, no change.  Worsening of symptoms, need for repeat debridement, etc.  TECHNIQUE:  The patient in supine position, after the induction of adequate general anesthesia, 2 g Kefzol, right lower extremity was prepped and draped in usual sterile fashion.  The left knee was prepped and draped in usual sterile fashion as well and we injected 40 mg Depo- Medrol through a medial portal under sterile conditions and placed a bandage over that.  The right lower extremity was prepped and draped in usual sterile fashion.  A lateral parapatellar  portal was fashioned with a #11 blade.  Ingress cannula atraumatically placed.  Irrigant was utilized to insufflate the joint.  Under direct visualization, a medial parapatellar portal was fashioned with a #11 blade after localization with 18-gauge needle sparing the medial meniscus.  Noted was a radial tear of the posterior third of medial meniscus.  I introduced a basket and resected it to a stable base.  They were further contoured with a Cuda shaver 3.5 Cauda shaver, grade 2 changes of femoral condyle were noted and light chondroplasty performed here.  There was some interstitial tearing of the ACL.  Extensive tearing of the lateral compartment meniscus, extending anteriorly to middle to posteriorly.  We introduced a shaver and debrided to a stable base to perform partial medial meniscectomy and also used straight baskets. This is shaved to a stable base.  The remnant was stable to probe palpation as it was immediately.  Chondroplasties were performed of the femoral condyle, tibial plateau, near grade 4 changes to the posterolateral tibial plateau.  No grade 4 changes of the femoral condyle, but extensive grade 3 changes.  Suprapatellar pouch revealed extensive grade 3 changes of the patella. Normal patellofemoral tracking.  Sulcus grade 3 changes, light chondroplasty performed of the patella and of the sulcus.  Gutters were unremarkable.  Revisit all compartments.  There was  loose cartilaginous debris that was evacuated as well.  No further pathology amenable to arthroscopic intervention.  I, therefore, removed all instrumentation. Portals were closed with 4-0 nylon simple sutures.  A 0.25% Marcaine with epinephrine was infiltrated in the joint.  Wound was dressed sterilely.  Awoken without difficulty and transported to the recovery room in satisfactory condition.  The patient tolerated the procedure well.  No complications.  Minimal blood loss.     Jene Every,  M.D.     Cordelia Pen  D:  09/07/2013  T:  09/08/2013  Job:  [id number]

## 2013-11-20 ENCOUNTER — Other Ambulatory Visit: Payer: Self-pay | Admitting: Orthopedic Surgery

## 2013-11-29 ENCOUNTER — Encounter (HOSPITAL_BASED_OUTPATIENT_CLINIC_OR_DEPARTMENT_OTHER): Payer: Self-pay | Admitting: *Deleted

## 2013-12-03 ENCOUNTER — Other Ambulatory Visit: Payer: Self-pay | Admitting: Orthopedic Surgery

## 2013-12-03 ENCOUNTER — Encounter (HOSPITAL_BASED_OUTPATIENT_CLINIC_OR_DEPARTMENT_OTHER): Payer: Self-pay | Admitting: *Deleted

## 2013-12-03 NOTE — Progress Notes (Signed)
NPO AFTER MN WITH EXCEPTION CLEAR LIQUIDS UNTIL 0700 (NO CREAM/ MILK PRODUCTS).  ARRIVE AT 1100.  NEEDS ISTAT. CURRENT EKG IN CHART AND EPIC. WILL TAKE NORVASC, TRAMADOL, TYLENOL, PLAQUENIL, AND FOLIC ACID AM DOS W/ SIPS OF WATER.

## 2013-12-03 NOTE — H&P (Signed)
[name] is an 63 y.o. female.   Chief Complaint: left knee pain, instability HPI: The patient is a 63 year old female who presents today for follow up of their knee. The patient is being followed for their left knee pain. Symptoms reported today include: pain and swelling, instability. Current treatment includes: activity modification and pain medications. The following medication has been used for pain control: Ultram. Refractory to NSAIDs, steroid injections, Euflexxa, pain medications, bracing  Past Medical History  Diagnosis Date  . Diverticulosis of sigmoid colon   . Hypertension   . Asthma due to environmental allergies   . History of gastric ulcer   . RA (rheumatoid arthritis)   . Arthritis   . Wears glasses   . Acute meniscal tear of left knee     Past Surgical History  Procedure Laterality Date  . Appendectomy  1976  . Vaginal hysterectomy  1983  . Shoulder open rotator cuff repair Bilateral right  1993/   left  1998  . Tarsal tunnel release Bilateral right 2010/   left 2009    feet  . Knee arthroscopy with lateral menisectomy Right 09/07/2013    Procedure: RIGHT KNEE ARTHROSCOPY WITH PARTIAL MEDIAL AND LATERAL MENISECTOMY/DEBRIDEMENT/ ;  Surgeon: Jeffrey C Beane, MD;  Location: [facility];  Service: Orthopedics;  Laterality: Right;    No family history on file. Social History:  reports that she quit smoking about 14 years ago. Her smoking use included Cigarettes. She has a 15 pack-year smoking history. She has never used smokeless tobacco. She reports that she does not drink alcohol or use illicit drugs.  Allergies:  Allergies  Allergen Reactions  . Asa [Aspirin] Other (See Comments)    Avoids asa 325mg, causes right side facial numbness and gi upset/   Pt can take asa 81mg with no issues  . Oxycodone Other (See Comments)    Caused sores/lesions in nose   . Sulfa Antibiotics Hives     (Not in a hospital admission)  No results found for  this or any previous visit (from the past 48 hour(s)). No results found.  Review of Systems  Constitutional: Negative.   HENT: Negative.   Eyes: Negative.   Respiratory: Negative.   Cardiovascular: Negative.   Gastrointestinal: Negative.   Genitourinary: Negative.   Musculoskeletal: Positive for joint pain.  Skin: Negative.   Neurological: Negative.   Psychiatric/Behavioral: Negative.     There were no vitals taken for this visit. Physical Exam  Constitutional: She is oriented to person, place, and time. She appears well-developed and well-nourished.  HENT:  Head: Normocephalic and atraumatic.  Eyes: Conjunctivae and EOM are normal. Pupils are equal, round, and reactive to light.  Neck: Normal range of motion. Neck supple.  Cardiovascular: Normal rate and regular rhythm.   Respiratory: Effort normal and breath sounds normal.  GI: Soft. Bowel sounds are normal.  Musculoskeletal:  On exam of the knee she is tender over the medial joint line. There is a positive McMurray. Moderate effusion. Restricted range. No evidence of, ecchymosis, deformity or erythema. No patellofemoral pain with compression. Nontender over the fibular head or the peroneal nerve. Nontender over the quadriceps insertion of the patellar ligament insertion. Provocative maneuvers revealed a negative Lachman, negative anterior and posterior drawer. No instability was noted with varus and valgus stressing at 0 or 30 degrees. On manual motor test the quadriceps and hamstrings were 5/5. Sensory exam was intact to light touch.  Neurological: She is alert and   oriented to person, place, and time. She has normal reflexes.  Skin: Skin is warm and dry.  Psychiatric: She has a normal mood and affect.   MRI of the knee demonstrates a medial meniscus tear, complex, posterior horn. Tricompartmental osteoarthrosis, diffuse, mild to moderate. There is a small Baker's cyst.  Assessment/Plan Left knee MMT, DJD Symptomatic  medial meniscus tear with underlying osteoarthritis and mechanical symptoms.  Proceed with a knee arthroscopy, procedure discussed.  I had a long discussion with the patient concerning the risks and benefits of knee arthroscopy including help from the arthroscopic procedure as well as no help from the arthroscopic procedure or worsening of symptoms. Also discussed infection, DVT, PE, anesthetic complications, etc. Also discussed the possibility of repeat arthroscopic surgery required in the future or total knee replacement. I provided the patient with an illustrated handout and discussed it in detail as well as discussed the postoperative and perioperative courses and return to functional activities including work. Need for postoperative DVT prophylaxis was discussed as well.  Left knee arthroscopy, debridement  Dayna Barker. Bissell PA-C for Dr. Shelle Iron 12/03/2013, 9:34 AM

## 2013-12-07 ENCOUNTER — Ambulatory Visit (HOSPITAL_BASED_OUTPATIENT_CLINIC_OR_DEPARTMENT_OTHER): Payer: BC Managed Care – PPO | Admitting: Anesthesiology

## 2013-12-07 ENCOUNTER — Encounter (HOSPITAL_BASED_OUTPATIENT_CLINIC_OR_DEPARTMENT_OTHER): Payer: Self-pay

## 2013-12-07 ENCOUNTER — Encounter (HOSPITAL_BASED_OUTPATIENT_CLINIC_OR_DEPARTMENT_OTHER)
Admission: RE | Disposition: A | Discharge status: Home / Self Care | Payer: Self-pay | Source: Ambulatory Visit | Attending: Specialist

## 2013-12-07 ENCOUNTER — Encounter (HOSPITAL_BASED_OUTPATIENT_CLINIC_OR_DEPARTMENT_OTHER): Payer: BC Managed Care – PPO | Admitting: Anesthesiology

## 2013-12-07 ENCOUNTER — Ambulatory Visit (HOSPITAL_BASED_OUTPATIENT_CLINIC_OR_DEPARTMENT_OTHER)
Admission: RE | Admit: 2013-12-07 | Discharge: 2013-12-07 | Disposition: A | Discharge status: Home / Self Care | Payer: BC Managed Care – PPO | Source: Ambulatory Visit | Attending: Specialist | Admitting: Specialist

## 2013-12-07 DIAGNOSIS — K219 Gastro-esophageal reflux disease without esophagitis: Secondary | ICD-10-CM | POA: Insufficient documentation

## 2013-12-07 DIAGNOSIS — M2240 Chondromalacia patellae, unspecified knee: Secondary | ICD-10-CM | POA: Insufficient documentation

## 2013-12-07 DIAGNOSIS — Z87891 Personal history of nicotine dependence: Secondary | ICD-10-CM | POA: Insufficient documentation

## 2013-12-07 DIAGNOSIS — J45909 Unspecified asthma, uncomplicated: Secondary | ICD-10-CM | POA: Insufficient documentation

## 2013-12-07 DIAGNOSIS — M069 Rheumatoid arthritis, unspecified: Secondary | ICD-10-CM | POA: Insufficient documentation

## 2013-12-07 DIAGNOSIS — M1712 Unilateral primary osteoarthritis, left knee: Secondary | ICD-10-CM

## 2013-12-07 DIAGNOSIS — M23329 Other meniscus derangements, posterior horn of medial meniscus, unspecified knee: Secondary | ICD-10-CM | POA: Insufficient documentation

## 2013-12-07 DIAGNOSIS — M1710 Unilateral primary osteoarthritis, unspecified knee: Secondary | ICD-10-CM | POA: Insufficient documentation

## 2013-12-07 DIAGNOSIS — I10 Essential (primary) hypertension: Secondary | ICD-10-CM | POA: Insufficient documentation

## 2013-12-07 HISTORY — DX: Unspecified tear of unspecified meniscus, current injury, left knee, initial encounter: S83.207A

## 2013-12-07 HISTORY — PX: KNEE ARTHROSCOPY: SHX127

## 2013-12-07 HISTORY — DX: Effusion, right knee: M25.461

## 2013-12-07 LAB — POCT I-STAT 4, (NA,K, GLUC, HGB,HCT)
Glucose, Bld: 65 mg/dL — ABNORMAL LOW (ref 70–99)
HCT: 35 % — ABNORMAL LOW (ref 36.0–46.0)
Hemoglobin: 11.9 g/dL — ABNORMAL LOW (ref 12.0–15.0)
Potassium: 3.3 mEq/L — ABNORMAL LOW (ref 3.7–5.3)
SODIUM: 137 meq/L (ref 137–147)

## 2013-12-07 SURGERY — ARTHROSCOPY, KNEE
Anesthesia: General | Site: Knee | Laterality: Left

## 2013-12-07 MED ORDER — KETOROLAC TROMETHAMINE 30 MG/ML IJ SOLN
INTRAMUSCULAR | Status: DC | PRN
  Administered 2013-12-07: 15 mg via INTRAVENOUS

## 2013-12-07 MED ORDER — FENTANYL CITRATE 0.05 MG/ML IJ SOLN
INTRAMUSCULAR | Status: DC | PRN
  Administered 2013-12-07 (×2): 50 ug via INTRAVENOUS

## 2013-12-07 MED ORDER — LIDOCAINE HCL (CARDIAC) 20 MG/ML IV SOLN
INTRAVENOUS | Status: DC | PRN
  Administered 2013-12-07: 80 mg via INTRAVENOUS

## 2013-12-07 MED ORDER — BUPIVACAINE-EPINEPHRINE 0.5% -1:200000 IJ SOLN
INTRAMUSCULAR | Status: DC | PRN
  Administered 2013-12-07: 20 mL

## 2013-12-07 MED ORDER — LACTATED RINGERS IV SOLN
INTRAVENOUS | Status: DC
  Administered 2013-12-07: 12:00:00 via INTRAVENOUS
  Filled 2013-12-07: qty 1000

## 2013-12-07 MED ORDER — TRAMADOL HCL 50 MG PO TABS: 50.0000 mg | ORAL_TABLET | Freq: Four times a day (QID) | ORAL | Status: DC | PRN

## 2013-12-07 MED ORDER — FENTANYL CITRATE 0.05 MG/ML IJ SOLN
INTRAMUSCULAR | Status: AC
  Filled 2013-12-07: qty 4

## 2013-12-07 MED ORDER — CEFAZOLIN SODIUM-DEXTROSE 2-3 GM-% IV SOLR
2.0000 g | INTRAVENOUS | Status: DC
Start: 2013-12-07 — End: 2013-12-07
  Filled 2013-12-07: qty 50

## 2013-12-07 MED ORDER — PROPOFOL 10 MG/ML IV BOLUS
INTRAVENOUS | Status: DC | PRN
  Administered 2013-12-07: 170 mg via INTRAVENOUS

## 2013-12-07 MED ORDER — KETOROLAC TROMETHAMINE 10 MG PO TABS: 10.0000 mg | ORAL_TABLET | Freq: Four times a day (QID) | ORAL | Status: DC | PRN

## 2013-12-07 MED ORDER — MIDAZOLAM HCL 5 MG/5ML IJ SOLN
INTRAMUSCULAR | Status: DC | PRN
  Administered 2013-12-07: 2 mg via INTRAVENOUS

## 2013-12-07 MED ORDER — MIDAZOLAM HCL 2 MG/2ML IJ SOLN
INTRAMUSCULAR | Status: AC
Start: 2013-12-07 — End: 2013-12-07
  Filled 2013-12-07: qty 2

## 2013-12-07 MED ORDER — FENTANYL CITRATE 0.05 MG/ML IJ SOLN
INTRAMUSCULAR | Status: AC
  Filled 2013-12-07: qty 2

## 2013-12-07 MED ORDER — LACTATED RINGERS IV SOLN
INTRAVENOUS | Status: DC
  Filled 2013-12-07: qty 1000

## 2013-12-07 MED ORDER — MIDAZOLAM HCL 2 MG/2ML IJ SOLN
INTRAMUSCULAR | Status: AC
  Filled 2013-12-07: qty 2

## 2013-12-07 MED ORDER — FENTANYL CITRATE 0.05 MG/ML IJ SOLN
25.0000 ug | INTRAMUSCULAR | Status: DC | PRN
  Filled 2013-12-07: qty 1

## 2013-12-07 MED ORDER — DEXAMETHASONE SODIUM PHOSPHATE 4 MG/ML IJ SOLN
INTRAMUSCULAR | Status: DC | PRN
  Administered 2013-12-07: 10 mg via INTRAVENOUS

## 2013-12-07 MED ORDER — ONDANSETRON HCL 4 MG/2ML IJ SOLN
INTRAMUSCULAR | Status: DC | PRN
  Administered 2013-12-07: 4 mg via INTRAVENOUS

## 2013-12-07 MED ORDER — EPINEPHRINE HCL 1 MG/ML IJ SOLN
INTRAMUSCULAR | Status: DC | PRN
  Administered 2013-12-07: 1 mg

## 2013-12-07 MED ORDER — SODIUM CHLORIDE 0.9 % IR SOLN
Status: DC | PRN
  Administered 2013-12-07: 6000 mL

## 2013-12-07 MED ORDER — PROMETHAZINE HCL 25 MG/ML IJ SOLN
6.2500 mg | INTRAMUSCULAR | Status: DC | PRN
  Filled 2013-12-07: qty 1

## 2013-12-07 SURGICAL SUPPLY — 48 items
BANDAGE ELASTIC 6 VELCRO ST LF (GAUZE/BANDAGES/DRESSINGS) ×3 IMPLANT
BANDAGE GAUZE ELAST BULKY 4 IN (GAUZE/BANDAGES/DRESSINGS) ×3 IMPLANT
BLADE 4.2CUDA (BLADE) IMPLANT
BLADE CUDA SHAVER 3.5 (BLADE) ×3 IMPLANT
BLADE GREAT WHITE 4.2 (BLADE) IMPLANT
BLADE GREAT WHITE 4.2MM (BLADE)
BNDG COHESIVE 6X5 TAN NS LF (GAUZE/BANDAGES/DRESSINGS) ×3 IMPLANT
BOOTIES KNEE HIGH SLOAN (MISCELLANEOUS) ×3 IMPLANT
CANISTER SUCT LVC 12 LTR MEDI- (MISCELLANEOUS) ×3 IMPLANT
CANISTER SUCTION 1200CC (MISCELLANEOUS) IMPLANT
CANISTER SUCTION 2500CC (MISCELLANEOUS) IMPLANT
CANNULA ACUFLEX KIT 5X76 (CANNULA) IMPLANT
CLOTH BEACON ORANGE TIMEOUT ST (SAFETY) ×3 IMPLANT
CUTTER MENISCUS  4.2MM (BLADE)
CUTTER MENISCUS 4.2MM (BLADE) IMPLANT
DRAPE ARTHROSCOPY W/POUCH 114 (DRAPES) ×3 IMPLANT
DURAPREP 26ML APPLICATOR (WOUND CARE) ×3 IMPLANT
ELECT REM PT RETURN 9FT ADLT (ELECTROSURGICAL)
ELECTRODE REM PT RTRN 9FT ADLT (ELECTROSURGICAL) IMPLANT
GAUZE XEROFORM 1X8 LF (GAUZE/BANDAGES/DRESSINGS) ×3 IMPLANT
GLOVE SURG SS PI 8.0 STRL IVOR (GLOVE) ×3 IMPLANT
GOWN PREVENTION PLUS LG XLONG (DISPOSABLE) IMPLANT
GOWN STRL REIN XL XLG (GOWN DISPOSABLE) IMPLANT
GOWN STRL REUS W/ TWL LRG LVL3 (GOWN DISPOSABLE) ×1 IMPLANT
GOWN STRL REUS W/ TWL XL LVL3 (GOWN DISPOSABLE) ×1 IMPLANT
GOWN STRL REUS W/TWL LRG LVL3 (GOWN DISPOSABLE) ×2
GOWN STRL REUS W/TWL XL LVL3 (GOWN DISPOSABLE) ×2
IV NS IRRIG 3000ML ARTHROMATIC (IV SOLUTION) ×6 IMPLANT
KNEE WRAP E Z 3 GEL PACK (MISCELLANEOUS) ×3 IMPLANT
MINI VAC (SURGICAL WAND) IMPLANT
NDL SAFETY ECLIPSE 18X1.5 (NEEDLE) ×1 IMPLANT
NEEDLE FILTER BLUNT 18X 1/2SAF (NEEDLE) ×2
NEEDLE FILTER BLUNT 18X1 1/2 (NEEDLE) ×1 IMPLANT
NEEDLE HYPO 18GX1.5 SHARP (NEEDLE) ×2
PACK ARTHROSCOPY DSU (CUSTOM PROCEDURE TRAY) ×3 IMPLANT
PACK BASIN DAY SURGERY FS (CUSTOM PROCEDURE TRAY) ×3 IMPLANT
PADDING CAST COTTON 6X4 STRL (CAST SUPPLIES) ×3 IMPLANT
RESECTOR FULL RADIUS 4.2MM (BLADE) IMPLANT
SET ARTHROSCOPY TUBING (MISCELLANEOUS) ×2
SET ARTHROSCOPY TUBING LN (MISCELLANEOUS) ×1 IMPLANT
SPONGE GAUZE 4X4 12PLY (GAUZE/BANDAGES/DRESSINGS) ×3 IMPLANT
SPONGE GAUZE 4X4 12PLY STER LF (GAUZE/BANDAGES/DRESSINGS) ×3 IMPLANT
SUT ETHILON 4 0 PS 2 18 (SUTURE) ×3 IMPLANT
SYR 30ML LL (SYRINGE) ×3 IMPLANT
SYRINGE 10CC LL (SYRINGE) ×3 IMPLANT
TOWEL OR 17X24 6PK STRL BLUE (TOWEL DISPOSABLE) ×3 IMPLANT
WAND 90 DEG TURBOVAC W/CORD (SURGICAL WAND) IMPLANT
WATER STERILE IRR 500ML POUR (IV SOLUTION) ×3 IMPLANT

## 2013-12-07 NOTE — Interval H&P Note (Signed)
History and Physical Interval Note:  12/07/2013 1:13 PM  [name]  has presented today for surgery, with the diagnosis of medial meniscal tear left knee  The various methods of treatment have been discussed with the patient and family. After consideration of risks, benefits and other options for treatment, the patient has consented to  Procedure(s): ARTHROSCOPY LEFT KNEE WITH DEBRIDEMENT (Left) as a surgical intervention .  The patient's history has been reviewed, patient examined, no change in status, stable for surgery.  I have reviewed the patient's chart and labs.  Questions were answered to the patient's satisfaction.     [name]

## 2013-12-07 NOTE — Anesthesia Procedure Notes (Signed)
Procedure Name: LMA Insertion Date/Time: 12/07/2013 1:20 PM Performed by: Maris Berger T Pre-anesthesia Checklist: Patient identified, Emergency Drugs available, Suction available and Patient being monitored Patient Re-evaluated:Patient Re-evaluated prior to inductionOxygen Delivery Method: Circle System Utilized Preoxygenation: Pre-oxygenation with 100% oxygen Intubation Type: IV induction Ventilation: Mask ventilation without difficulty LMA: LMA inserted LMA Size: 4.0 Number of attempts: 1 Airway Equipment and Method: bite block Placement Confirmation: positive ETCO2 Dental Injury: Teeth and Oropharynx as per pre-operative assessment

## 2013-12-07 NOTE — Transfer of Care (Signed)
Immediate Anesthesia Transfer of Care Note  Patient: [name]  Procedure(s) Performed: Procedure(s): ARTHROSCOPY LEFT KNEE WITH DEBRIDEMENT (Left)  Patient Location: PACU  Anesthesia Type:General  Level of Consciousness: awake  Airway & Oxygen Therapy: breathing spont, o2 per [facility]   Post-op Assessment: Report given to PACU RN  Post vital signs: Reviewed and stable  Complications: No apparent anesthesia complications

## 2013-12-07 NOTE — Brief Op Note (Signed)
12/07/2013  1:14 PM  PATIENT:  [name]  63 y.o. female  PRE-OPERATIVE DIAGNOSIS:  medial meniscal tear left knee  POST-OPERATIVE DIAGNOSIS:  * No post-op diagnosis entered *  PROCEDURE:  Procedure(s): ARTHROSCOPY LEFT KNEE WITH DEBRIDEMENT (Left)  SURGEON:  Surgeon(s) and Role:    * Javier Docker, MD - Primary  PHYSICIAN ASSISTANT:   ASSISTANTS: none   ANESTHESIA:   general  EBL:     BLOOD ADMINISTERED:none  DRAINS: none   LOCAL MEDICATIONS USED:  MARCAINE     SPECIMEN:  No Specimen  DISPOSITION OF SPECIMEN:  N/A  COUNTS:  YES  TOURNIQUET:  * No tourniquets in log *  DICTATION: .Other Dictation: Dictation Number [id number]  PLAN OF CARE: Discharge to home after PACU  PATIENT DISPOSITION:  PACU - hemodynamically stable.   Delay start of Pharmacological VTE agent (>24hrs) due to surgical blood loss or risk of bleeding: no

## 2013-12-07 NOTE — Anesthesia Preprocedure Evaluation (Signed)
Anesthesia Evaluation  Patient identified by MRN, date of birth, ID band Patient awake    Reviewed: Allergy & Precautions, H&P , NPO status , Patient's Chart, lab work & pertinent test results  Airway Mallampati: II TM Distance: >3 FB Neck ROM: Full    Dental no notable dental hx.    Pulmonary asthma , former smoker,  breath sounds clear to auscultation  Pulmonary exam normal       Cardiovascular hypertension, Pt. on medications Rhythm:Regular Rate:Normal     Neuro/Psych negative neurological ROS  negative psych ROS   GI/Hepatic Neg liver ROS, GERD-  Medicated,  Endo/Other  negative endocrine ROS  Renal/GU negative Renal ROS  negative genitourinary   Musculoskeletal negative musculoskeletal ROS (+) Arthritis -, Rheumatoid disorders,    Abdominal   Peds negative pediatric ROS (+)  Hematology negative hematology ROS (+)   Anesthesia Other Findings   Reproductive/Obstetrics negative OB ROS                           Anesthesia Physical Anesthesia Plan  ASA: III  Anesthesia Plan: General   Post-op Pain Management:    Induction: Intravenous  Airway Management Planned: LMA  Additional Equipment:   Intra-op Plan:   Post-operative Plan: Extubation in OR  Informed Consent: I have reviewed the patients History and Physical, chart, labs and discussed the procedure including the risks, benefits and alternatives for the proposed anesthesia with the patient or authorized representative who has indicated his/her understanding and acceptance.   Dental advisory given  Plan Discussed with: CRNA and Surgeon  Anesthesia Plan Comments:         Anesthesia Quick Evaluation

## 2013-12-07 NOTE — Discharge Instructions (Signed)
ARTHROSCOPIC KNEE SURGERY HOME CARE INSTRUCTIONS   PAIN You will be expected to have a moderate amount of pain in the affected knee for approximately two weeks.  However, the first two to four days will be the most severe in terms of the pain you will experience.  Prescriptions have been provided for you to take as needed for the pain.  The pain can be markedly reduced by using the ice/compressive bandage given.  Exchange the ice packs whenever they thaw.  During the night, keep the bandage on because it will still provide some compression for the swelling.  Also, keep the leg elevated on pillows above your heart, and this will help alleviate the pain and swelling.  MEDICATION Prescriptions have been provided to take as needed for pain. To prevent blood clots, take Aspirin 325mg  daily with a meal if not on a blood thinner and if no history of stomach ulcers.  ACTIVITY It is preferred that you stay on bedrest for approximately 24 hours.  However, you may go to the bathroom with help.  After this, you can start to be up and about progressively more.  Remember that the swelling may still increase after three to four days if you are up and doing too much.  You may put as much weight on the affected leg as pain will allow.  Use your crutches for comfort and safety.  However, as soon as you are able, you may discard the crutches and go without them.   DRESSING Keep the current dressing as dry as possible.  Two days after your surgery, you may remove the ice/compressive wrap, and surgical dressing.  You may now take a shower, but do not scrub the sounds directly with soap.  Let water rinse over these and gently wipe with your hand.  Reapply band-aids over the puncture wounds and more gauze if needed.  A slight amount of thin drainage can be normal at this time, and do not let it frighten you.  Reapply the ice/compressive wrap.  You may now repeat this every day each time you shower.  SYMPTOMS TO REPORT TO  YOUR DOCTOR  -Extreme pain.  -Extreme swelling.  -Temperature above 101 degrees that does not come down with acetaminophen     (Tylenol).  -Any changes in the feeling, color or movement of your toes.  -Extreme redness, heat, swelling or drainage at your incision  EXERCISE It is preferred that you begin to exercise on the day of your surgery.  Straight leg raises and short arc quads should be begun the afternoon or evening of surgery and continued until you come back for your follow-up appointment.   Attached is an instruction sheet on how to perform these two simple exercises.  Do these at least three times per day if not more.  You may bend your knee as much as is comfortable.  The puncture wounds may occasionally be slightly uncomfortable with bending of the knee.  Do not let this frighten you.  It is important to keep your knee motion, but do not overdo it.  If you have significant pain, simply do not bend the knee as far.   You will be given more exercises to perform at your first return visit.    RETURN APPOINTMENT Please make an appointment to be seen by your doctor in 14 days from your surgery.  Patient Signature:  ________________________________________________ Post Anesthesia Home Care Instructions  Activity: Get plenty of rest for the remainder of the day.  A responsible adult should stay with you for 24 hours following the procedure.  For the next 24 hours, DO NOT: -Drive a car -Advertising copywriter -Drink alcoholic beverages -Take any medication unless instructed by your physician -Make any legal decisions or sign important papers.  Meals: Start with liquid foods such as gelatin or soup. Progress to regular foods as tolerated. Avoid greasy, spicy, heavy foods. If nausea and/or vomiting occur, drink only clear liquids until the nausea and/or vomiting subsides. Call your physician if vomiting continues.  Special Instructions/Symptoms: Your throat may feel dry or sore from the  anesthesia or the breathing tube placed in your throat during surgery. If this causes discomfort, gargle with warm salt water. The discomfort should disappear within 24 hours. ________  Nurse's Signature:  ________________________________________________________

## 2013-12-07 NOTE — H&P (View-Only) (Signed)
[name] is an 63 y.o. female.   Chief Complaint: left knee pain, instability HPI: The patient is a 63 year old female who presents today for follow up of their knee. The patient is being followed for their left knee pain. Symptoms reported today include: pain and swelling, instability. Current treatment includes: activity modification and pain medications. The following medication has been used for pain control: Ultram. Refractory to NSAIDs, steroid injections, Euflexxa, pain medications, bracing  Past Medical History  Diagnosis Date  . Diverticulosis of sigmoid colon   . Hypertension   . Asthma due to environmental allergies   . History of gastric ulcer   . RA (rheumatoid arthritis)   . Arthritis   . Wears glasses   . Acute meniscal tear of left knee     Past Surgical History  Procedure Laterality Date  . Appendectomy  1976  . Vaginal hysterectomy  1983  . Shoulder open rotator cuff repair Bilateral right  1993/   left  1998  . Tarsal tunnel release Bilateral right 2010/   left 2009    feet  . Knee arthroscopy with lateral menisectomy Right 09/07/2013    Procedure: RIGHT KNEE ARTHROSCOPY WITH PARTIAL MEDIAL AND LATERAL MENISECTOMY/DEBRIDEMENT/ ;  Surgeon: Javier Docker, MD;  Location:  SUR[facility]ERY C[facility];  Service: Orthopedics;  Laterality: Right;    No family history on file. Social History:  reports that she quit smoking about 14 years ago. Her smoking use included Cigarettes. She has a 15 pack-year smoking history. She has never used smokeless tobacco. She reports that she does not drink alcohol or use illicit drugs.  Allergies:  Allergies  Allergen Reactions  . Asa [Aspirin] Other (See Comments)    Avoids asa 325mg , causes right side facial numbness and gi upset/   Pt can take asa 81mg  with no issues  . Oxycodone Other (See Comments)    Caused sores/lesions in nose   . Sulfa Antibiotics Hives     (Not in a hospital admission)  No results found for  this or any previous visit (from the past 48 hour(s)). No results found.  Review of Systems  Constitutional: Negative.   HENT: Negative.   Eyes: Negative.   Respiratory: Negative.   Cardiovascular: Negative.   Gastrointestinal: Negative.   Genitourinary: Negative.   Musculoskeletal: Positive for joint pain.  Skin: Negative.   Neurological: Negative.   Psychiatric/Behavioral: Negative.     There were no vitals taken for this visit. Physical Exam  Constitutional: She is oriented to person, place, and time. She appears well-developed and well-nourished.  HENT:  Head: Normocephalic and atraumatic.  Eyes: Conjunctivae and EOM are normal. Pupils are equal, round, and reactive to light.  Neck: Normal range of motion. Neck supple.  Cardiovascular: Normal rate and regular rhythm.   Respiratory: Effort normal and breath sounds normal.  GI: Soft. Bowel sounds are normal.  Musculoskeletal:  On exam of the knee she is tender over the medial joint line. There is a positive McMurray. Moderate effusion. Restricted range. No evidence of, ecchymosis, deformity or erythema. No patellofemoral pain with compression. Nontender over the fibular head or the peroneal nerve. Nontender over the quadriceps insertion of the patellar ligament insertion. Provocative maneuvers revealed a negative Lachman, negative anterior and posterior drawer. No instability was noted with varus and valgus stressing at 0 or 30 degrees. On manual motor test the quadriceps and hamstrings were 5/5. Sensory exam was intact to light touch.  Neurological: She is alert and  oriented to person, place, and time. She has normal reflexes.  Skin: Skin is warm and dry.  Psychiatric: She has a normal mood and affect.   MRI of the knee demonstrates a medial meniscus tear, complex, posterior horn. Tricompartmental osteoarthrosis, diffuse, mild to moderate. There is a small Baker's cyst.  Assessment/Plan Left knee MMT, DJD Symptomatic  medial meniscus tear with underlying osteoarthritis and mechanical symptoms.  Proceed with a knee arthroscopy, procedure discussed.  I had a long discussion with the patient concerning the risks and benefits of knee arthroscopy including help from the arthroscopic procedure as well as no help from the arthroscopic procedure or worsening of symptoms. Also discussed infection, DVT, PE, anesthetic complications, etc. Also discussed the possibility of repeat arthroscopic surgery required in the future or total knee replacement. I provided the patient with an illustrated handout and discussed it in detail as well as discussed the postoperative and perioperative courses and return to functional activities including work. Need for postoperative DVT prophylaxis was discussed as well.  Left knee arthroscopy, debridement  Dayna Barker. Della Scrivener PA-C for Dr. Shelle Iron 12/03/2013, 9:34 AM

## 2013-12-08 NOTE — Anesthesia Postprocedure Evaluation (Signed)
  Anesthesia Post-op Note  Patient: [name]  Procedure(s) Performed: Procedure(s) (LRB): ARTHROSCOPY LEFT KNEE WITH DEBRIDEMENT (Left)  Patient Location: PACU  Anesthesia Type: General  Level of Consciousness: awake and alert   Airway and Oxygen Therapy: Patient Spontanous Breathing  Post-op Pain: mild  Post-op Assessment: Post-op Vital signs reviewed, Patient's Cardiovascular Status Stable, Respiratory Function Stable, Patent Airway and No signs of Nausea or vomiting  Last Vitals:  Filed Vitals:   12/07/13 1512  BP: 144/70  Pulse: 74  Temp: 36 C  Resp: 14    Post-op Vital Signs: stable   Complications: No apparent anesthesia complications

## 2013-12-10 ENCOUNTER — Encounter (HOSPITAL_BASED_OUTPATIENT_CLINIC_OR_DEPARTMENT_OTHER): Payer: Self-pay | Admitting: Specialist

## 2013-12-10 NOTE — Op Note (Signed)
NAME:  [name], [name] NO.:  [id number]  MEDICAL RECORD NO.:  [id number]  LOCATION:                                 FACILITY:  PHYSICIAN:  Jene Every, M.D.    DATE OF BIRTH:  1951/01/09  DATE OF PROCEDURE:  12/07/2013 DATE OF DISCHARGE:                              OPERATIVE REPORT   PREOPERATIVE DIAGNOSIS:  Medial meniscus tear, left knee.  POSTOPERATIVE DIAGNOSES:  Medial meniscus tear, left knee, grade 3 chondromalacia, medial femoral condyle, medial tibial plateau, patella and femoral sulcus.  PROCEDURE PERFORMED:  Left knee arthroscopy, partial medial meniscectomy, chondroplasty, medial femoral condyle, medial tibial plateau, patella and sulcus.  ANESTHESIA:  General.  ASSISTANT:  No assistant.  HISTORY:  ______  giving way.  MRI indicating meniscus tear, indicated for partial meniscectomy.  Risks and benefits discussed including bleeding, infection, damage to neurovascular structures, DVT, PE, anesthetic complications, etc.  TECHNIQUE:  Patient in supine position, after induction of adequate general anesthesia and 2 g Kefzol, left lower extremity was prepped and draped in usual sterile fashion.  A lateral parapatellar portal was fashioned with a #11 blade.  Ingress cannula was atraumatically placed. Irrigant was utilized to insufflate the joint.  Under direct visualization, a medial parapatellar portal was fashioned with a #11 blade after localization with an 18-gauge needle sparing the medial meniscus.  Noted was extensive grade 3 changes of femoral condyle with tibial plateau and a complex tear of the posterior third of the medial meniscus.  Introduced a Doctor, hospital and resected the tear to a stable base approximately 50% and posterior third was excised.  Light chondroplasty performed with femoral condyle and tibial plateau where there was extensive 3 x 3 region of chondromalacia.  No grade 4 lesion. The remnant of the meniscus was stable  to palpation.  ACL was unremarkable.  Lateral compartment revealed essentially normal femoral condyle, tibial plateau, and meniscus stable to probe palpation, some minor grade 2 changes.  Suprapatellar pouch revealed grade 3 changes of the patella and the sulcus.  Light chondroplasty performed here.  Normal patellofemoral tracking.  Gutters were unremarkable.  Re-visited all compartments, no further pathology amenable to arthroscopic intervention and, therefore, removed all instrumentation.  Portals were closed with 4-0 nylon simple sutures, 0.25% Marcaine with epinephrine was infiltrated in the joint.  Wound was dressed sterilely, awoken without difficulty and transported to the recovery room in satisfactory condition.  The patient tolerated the procedure well.  No complications.  No assistant.  Minimal blood loss.     Jene Every, M.D.     Cordelia Pen  D:  12/07/2013  T:  12/08/2013  Job:  [id number]

## 2016-01-05 ENCOUNTER — Other Ambulatory Visit: Payer: Self-pay | Admitting: Physician Assistant

## 2016-01-05 ENCOUNTER — Ambulatory Visit
Admission: RE | Admit: 2016-01-05 | Discharge: 2016-01-05 | Disposition: A | Discharge status: Home / Self Care | Payer: Medicare Other | Source: Ambulatory Visit | Attending: Physician Assistant | Admitting: Physician Assistant

## 2016-01-05 DIAGNOSIS — R1032 Left lower quadrant pain: Secondary | ICD-10-CM

## 2016-06-10 ENCOUNTER — Ambulatory Visit: Payer: Self-pay | Admitting: Orthopedic Surgery

## 2016-07-15 NOTE — Patient Instructions (Addendum)
[name]  07/15/2016   Your procedure is scheduled on: Thursday 07/22/2016  Report to [facility] Main  Entrance take [place]  elevators to 3rd floor to  Short Stay Center at  0515  AM.  Call this number if you have problems the morning of surgery [phone]   Remember: ONLY 1 PERSON MAY GO WITH YOU TO SHORT STAY TO GET  READY MORNING OF YOUR SURGERY.   Do not eat food or drink liquids :After Midnight.     Take these medicines the morning of surgery with A SIP OF WATER: Amlodipine (Norvasc), use Restasis eye drops and use Albuterol inhaler if needed                                  You may not have any metal on your body including hair pins and              piercings  Do not wear jewelry, make-up, lotions, powders or perfumes, deodorant             Do not wear nail polish.  Do not shave  48 hours prior to surgery.              Men may shave face and neck.   Do not bring valuables to the hospital. [facility] IS NOT             RESPONSIBLE   FOR VALUABLES.  Contacts, dentures or bridgework may not be worn into surgery.  Leave suitcase in the car. After surgery it may be brought to your room.               Please read over the following fact sheets you were given: _____________________________________________________________________             [facility] - Preparing for Surgery Before surgery, you can play an important role.  Because skin is not sterile, your skin needs to be as free of germs as possible.  You can reduce the number of germs on your skin by washing with CHG (chlorahexidine gluconate) soap before surgery.  CHG is an antiseptic cleaner which kills germs and bonds with the skin to continue killing germs even after washing. Please DO NOT use if you have an allergy to CHG or antibacterial soaps.  If your skin becomes reddened/irritated stop using the CHG and inform your nurse when you arrive at Short Stay. Do not shave (including legs and  underarms) for at least 48 hours prior to the first CHG shower.  You may shave your face/neck. Please follow these instructions carefully:  1.  Shower with CHG Soap the night before surgery and the  morning of Surgery.  2.  If you choose to wash your hair, wash your hair first as usual with your  normal  shampoo.  3.  After you shampoo, rinse your hair and body thoroughly to remove the  shampoo.                           4.  Use CHG as you would any other liquid soap.  You can apply chg directly  to the skin and wash  Gently with a scrungie or clean washcloth.  5.  Apply the CHG Soap to your body ONLY FROM THE NECK DOWN.   Do not use on face/ open                           Wound or open sores. Avoid contact with eyes, ears mouth and genitals (private parts).                       Wash face,  Genitals (private parts) with your normal soap.             6.  Wash thoroughly, paying special attention to the area where your surgery  will be performed.  7.  Thoroughly rinse your body with warm water from the neck down.  8.  DO NOT shower/wash with your normal soap after using and rinsing off  the CHG Soap.                9.  Pat yourself dry with a clean towel.            10.  Wear clean pajamas.            11.  Place clean sheets on your bed the night of your first shower and do not  sleep with pets. Day of Surgery : Do not apply any lotions/deodorants the morning of surgery.  Please wear clean clothes to the hospital/surgery center.  FAILURE TO FOLLOW THESE INSTRUCTIONS MAY RESULT IN THE CANCELLATION OF YOUR SURGERY PATIENT SIGNATURE_________________________________  NURSE SIGNATURE__________________________________  ________________________________________________________________________   Adam Phenix  An incentive spirometer is a tool that can help keep your lungs clear and active. This tool measures how well you are filling your lungs with each breath. Taking  long deep breaths may help reverse or decrease the chance of developing breathing (pulmonary) problems (especially infection) following:  A long period of time when you are unable to move or be active. BEFORE THE PROCEDURE   If the spirometer includes an indicator to show your best effort, your nurse or respiratory therapist will set it to a desired goal.  If possible, sit up straight or lean slightly forward. Try not to slouch.  Hold the incentive spirometer in an upright position. INSTRUCTIONS FOR USE  1. Sit on the edge of your bed if possible, or sit up as far as you can in bed or on a chair. 2. Hold the incentive spirometer in an upright position. 3. Breathe out normally. 4. Place the mouthpiece in your mouth and seal your lips tightly around it. 5. Breathe in slowly and as deeply as possible, raising the piston or the ball toward the top of the column. 6. Hold your breath for 3-5 seconds or for as long as possible. Allow the piston or ball to fall to the bottom of the column. 7. Remove the mouthpiece from your mouth and breathe out normally. 8. Rest for a few seconds and repeat Steps 1 through 7 at least 10 times every 1-2 hours when you are awake. Take your time and take a few normal breaths between deep breaths. 9. The spirometer may include an indicator to show your best effort. Use the indicator as a goal to work toward during each repetition. 10. After each set of 10 deep breaths, practice coughing to be sure your lungs are clear. If you have an incision (the cut made at the time of surgery),  support your incision when coughing by placing a pillow or rolled up towels firmly against it. Once you are able to get out of bed, walk around indoors and cough well. You may stop using the incentive spirometer when instructed by your caregiver.  RISKS AND COMPLICATIONS  Take your time so you do not get dizzy or light-headed.  If you are in pain, you may need to take or ask for pain  medication before doing incentive spirometry. It is harder to take a deep breath if you are having pain. AFTER USE  Rest and breathe slowly and easily.  It can be helpful to keep track of a log of your progress. Your caregiver can provide you with a simple table to help with this. If you are using the spirometer at home, follow these instructions: Ray IF:   You are having difficultly using the spirometer.  You have trouble using the spirometer as often as instructed.  Your pain medication is not giving enough relief while using the spirometer.  You develop fever of 100.5 F (38.1 C) or higher. SEEK IMMEDIATE MEDICAL CARE IF:   You cough up bloody sputum that had not been present before.  You develop fever of 102 F (38.9 C) or greater.  You develop worsening pain at or near the incision site. MAKE SURE YOU:   Understand these instructions.  Will watch your condition.  Will get help right away if you are not doing well or get worse. Document Released: 10/25/2006 Document Revised: 09/06/2011 Document Reviewed: 12/26/2006 [employer] Patient Information 2014 [employer], [place].   ________________________________________________________________________  WHAT IS A BLOOD TRANSFUSION? Blood Transfusion Information  A transfusion is the replacement of blood or some of its parts. Blood is made up of multiple cells which provide different functions.  Red blood cells carry oxygen and are used for blood loss replacement.  White blood cells fight against infection.  Platelets control bleeding.  Plasma helps clot blood.  Other blood products are available for specialized needs, such as hemophilia or other clotting disorders. BEFORE THE TRANSFUSION  Who gives blood for transfusions?   Healthy volunteers who are fully evaluated to make sure their blood is safe. This is blood bank blood. Transfusion therapy is the safest it has ever been in the practice of medicine.  Before blood is taken from a donor, a complete history is taken to make sure that person has no history of diseases nor engages in risky social behavior (examples are intravenous drug use or sexual activity with multiple partners). The donor's travel history is screened to minimize risk of transmitting infections, such as malaria. The donated blood is tested for signs of infectious diseases, such as HIV and hepatitis. The blood is then tested to be sure it is compatible with you in order to minimize the chance of a transfusion reaction. If you or a relative donates blood, this is often done in anticipation of surgery and is not appropriate for emergency situations. It takes many days to process the donated blood. RISKS AND COMPLICATIONS Although transfusion therapy is very safe and saves many lives, the main dangers of transfusion include:   Getting an infectious disease.  Developing a transfusion reaction. This is an allergic reaction to something in the blood you were given. Every precaution is taken to prevent this. The decision to have a blood transfusion has been considered carefully by your caregiver before blood is given. Blood is not given unless the benefits outweigh the risks. AFTER THE TRANSFUSION  Right after receiving a blood transfusion, you will usually feel much better and more energetic. This is especially true if your red blood cells have gotten low (anemic). The transfusion raises the level of the red blood cells which carry oxygen, and this usually causes an energy increase.  The nurse administering the transfusion will monitor you carefully for complications. HOME CARE INSTRUCTIONS  No special instructions are needed after a transfusion. You may find your energy is better. Speak with your caregiver about any limitations on activity for underlying diseases you may have. SEEK MEDICAL CARE IF:   Your condition is not improving after your transfusion.  You develop redness or  irritation at the intravenous (IV) site. SEEK IMMEDIATE MEDICAL CARE IF:  Any of the following symptoms occur over the next 12 hours:  Shaking chills.  You have a temperature by mouth above 102 F (38.9 C), not controlled by medicine.  Chest, back, or muscle pain.  People around you feel you are not acting correctly or are confused.  Shortness of breath or difficulty breathing.  Dizziness and fainting.  You get a rash or develop hives.  You have a decrease in urine output.  Your urine turns a dark color or changes to pink, red, or brown. Any of the following symptoms occur over the next 10 days:  You have a temperature by mouth above 102 F (38.9 C), not controlled by medicine.  Shortness of breath.  Weakness after normal activity.  The white part of the eye turns yellow (jaundice).  You have a decrease in the amount of urine or are urinating less often.  Your urine turns a dark color or changes to pink, red, or brown. Document Released: 06/11/2000 Document Revised: 09/06/2011 Document Reviewed: 01/29/2008 [facility] Patient Information 2014 [place], [place].  _______________________________________________________________________

## 2016-07-16 ENCOUNTER — Encounter (INDEPENDENT_AMBULATORY_CARE_PROVIDER_SITE_OTHER): Payer: Self-pay

## 2016-07-16 ENCOUNTER — Encounter (HOSPITAL_COMMUNITY)
Admission: RE | Admit: 2016-07-16 | Discharge: 2016-07-16 | Disposition: A | Discharge status: Home / Self Care | Payer: Medicare Other | Source: Ambulatory Visit | Attending: Specialist | Admitting: Specialist

## 2016-07-16 ENCOUNTER — Encounter (HOSPITAL_COMMUNITY): Payer: Self-pay

## 2016-07-16 ENCOUNTER — Ambulatory Visit: Payer: Self-pay | Admitting: Orthopedic Surgery

## 2016-07-16 DIAGNOSIS — Z01818 Encounter for other preprocedural examination: Secondary | ICD-10-CM | POA: Diagnosis present

## 2016-07-16 DIAGNOSIS — I4510 Unspecified right bundle-branch block: Secondary | ICD-10-CM | POA: Insufficient documentation

## 2016-07-16 DIAGNOSIS — Z01812 Encounter for preprocedural laboratory examination: Secondary | ICD-10-CM | POA: Diagnosis not present

## 2016-07-16 DIAGNOSIS — M1711 Unilateral primary osteoarthritis, right knee: Secondary | ICD-10-CM | POA: Diagnosis not present

## 2016-07-16 DIAGNOSIS — I10 Essential (primary) hypertension: Secondary | ICD-10-CM | POA: Insufficient documentation

## 2016-07-16 DIAGNOSIS — R9431 Abnormal electrocardiogram [ECG] [EKG]: Secondary | ICD-10-CM | POA: Insufficient documentation

## 2016-07-16 DIAGNOSIS — Z0183 Encounter for blood typing: Secondary | ICD-10-CM | POA: Insufficient documentation

## 2016-07-16 HISTORY — DX: Other specified postprocedural states: Z98.890

## 2016-07-16 HISTORY — DX: Adverse effect of unspecified anesthetic, initial encounter: T41.45XA

## 2016-07-16 HISTORY — DX: Other complications of anesthesia, initial encounter: T88.59XA

## 2016-07-16 HISTORY — DX: Other specified postprocedural states: R11.2

## 2016-07-16 LAB — BASIC METABOLIC PANEL
ANION GAP: 7 (ref 5–15)
BUN: 18 mg/dL (ref 6–20)
CHLORIDE: 100 mmol/L — AB (ref 101–111)
CO2: 29 mmol/L (ref 22–32)
Calcium: 9.5 mg/dL (ref 8.9–10.3)
Creatinine, Ser: 0.93 mg/dL (ref 0.44–1.00)
Glucose, Bld: 91 mg/dL (ref 65–99)
POTASSIUM: 3.6 mmol/L (ref 3.5–5.1)
SODIUM: 136 mmol/L (ref 135–145)

## 2016-07-16 LAB — URINALYSIS, ROUTINE W REFLEX MICROSCOPIC
Bacteria, UA: NONE SEEN
Bilirubin Urine: NEGATIVE
GLUCOSE, UA: NEGATIVE mg/dL
KETONES UR: NEGATIVE mg/dL
Leukocytes, UA: NEGATIVE
Nitrite: NEGATIVE
PH: 7 (ref 5.0–8.0)
PROTEIN: NEGATIVE mg/dL
Specific Gravity, Urine: 1.01 (ref 1.005–1.030)

## 2016-07-16 LAB — CBC
HEMATOCRIT: 36.6 % (ref 36.0–46.0)
HEMOGLOBIN: 12.6 g/dL (ref 12.0–15.0)
MCH: 33 pg (ref 26.0–34.0)
MCHC: 34.4 g/dL (ref 30.0–36.0)
MCV: 95.8 fL (ref 78.0–100.0)
Platelets: 262 10*3/uL (ref 150–400)
RBC: 3.82 MIL/uL — AB (ref 3.87–5.11)
RDW: 14.8 % (ref 11.5–15.5)
WBC: 6.1 10*3/uL (ref 4.0–10.5)

## 2016-07-16 LAB — APTT: aPTT: 41 seconds — ABNORMAL HIGH (ref 24–36)

## 2016-07-16 LAB — SURGICAL PCR SCREEN
MRSA, PCR: NEGATIVE
Staphylococcus aureus: POSITIVE — AB

## 2016-07-16 LAB — PROTIME-INR
INR: 0.93
Prothrombin Time: 12.5 seconds (ref 11.4–15.2)

## 2016-07-19 ENCOUNTER — Ambulatory Visit: Payer: Self-pay | Admitting: Orthopedic Surgery

## 2016-07-19 NOTE — H&P (Signed)
[name] DOB: 1951-01-25 Married / Language: [place] / Race: White Female  H&P Date: 07/19/16  Chief Complaint: Right knee pain  History of Present Illness The patient is a 66 year old female who comes in today for a preoperative History and Physical. The patient is scheduled for a right total knee arthroplasty to be performed by Dr. Javier Docker, MD at [facility] on 07/22/2016. Narcissus reports chronic R knee pain progressively worsening for years duration at this point, with hx of prior arthroscopy. Pain refractory to cortisone and viscosupplementation injections, activity modifications, quad strengthening, ice, elevation, NSAIDs, medications, relative rest. At this point pain is interfering with her ADLs and quality of life and she desires to proceed with surgery. She does have a history of RA and is on MTX and hydroxychloroquine.  Dr. Shelle Iron and the patient mutually agreed to proceed with a Right total knee replacement. Risks and benefits of the procedure were discussed including stiffness, suboptimal range of motion, persistent pain, infection requiring removal of prosthesis and reinsertion, need for prophylactic antibiotics in the future, for example, dental procedures, possible need for manipulation, revision in the future and also anesthetic complications including DVT, PE, etc. We discussed the perioperative course, time in the hospital, postoperative recovery and the need for elevation to control swelling. We also discussed the predicted range of motion and the probability that squatting and kneeling would be unobtainable in the future. In addition, postoperative anticoagulation was discussed. We have obtained preoperative medical clearance as necessary (Dr. Lendon Colonel and Dr. Andrey Campanile). Provided her illustrated handout and discussed it in detail. They will enroll in the total joint replacement educational forum at the hospital.  She is unable to tolerate ASA (allergy- one side of her  body goes numb). She has had both Hydrocodone and Oxycodone for pain in the past and they were ineffective (for nerve pain)- Hydrocodone was effective for shoulder surgery pain. She did not have an adverse event with either medication and does not recall N/V.  She completed her pre-op appt at [facility] last week and was staph positive but MRSA negative. She denies hx of DVT.  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)  Lumbar radiculopathy (M54.16)  Traumatic ecchymosis of right hand, initial encounter (C37.628B)  Degenerative lumbar disc (M51.36)  Primary osteoarthritis of left knee (M17.12)  Spinal stenosis, lumbar (M48.061)  Spondylolisthesis, lumbar region (M43.16)  Acquired Metacarpophalangeal joint pain of right hand (M79.641)  Migraine Headache  Allergic Urticaria  Rheumatoid Arthritis  Ulcer disease  Fibromyalgia  Osteoarthritis  Gastroesophageal Reflux Disease  Chronic Pain  Kidney Stone  Asthma  High blood pressure   Allergies Methadose *ANALGESICS - OPIOID*  Pamelor *ANTIDEPRESSANTS*  Aspirin *ANALGESICS - NonNarcotic*  one side of body goes numb SULFA [04/08/2004]: anti-inflammatories [04/08/2004]:  Family History Cerebrovascular Accident  Father. Hypertension  Brother, Father, Mother, Sister. Diabetes Mellitus  Father, Sister. Heart Disease  Father, Mother. Osteoarthritis  Mother. First Degree Relatives   Social History  Tobacco use  Former smoker. 08/03/2013: smoke(d) 1/2 pack(s) per day Current work status  retired Never consumed alcohol  08/03/2013: Never consumed alcohol Children  2 Tobacco / smoke exposure  08/03/2013: no Living situation  live with spouse Number of flights of stairs before winded  2-3 No history of drug/alcohol rehab  Marital status  married Exercise   Exercises daily; does running / walking and team sport Not  under pain contract   Medication History Methotrexate Sodium (2.5MG  Tablet, 10 Oral once a week) Active. Amlodipine 5mg  Active. Indapamide (1.25MG  Tablet, Oral) Active. Potassium Chloride ER ( Tablet ER, Oral) Active. Zolpidem Tartrate (10MG  Tablet, Oral) Active. Folic Acid (1MG  Tablet, Oral) Active. Hydroxychloroquine Sulfate (200MG  Tablet, Oral) Active. Vitamin D (2000UNIT Tablet, Oral) Active. Feverfew (Oral) Specific strength unknown - Active. Medications Reconciled  Past Surgical History Foot Surgery  bilateral Hysterectomy  partial (non-cancerous) Rotator Cuff Repair  bilateral Appendectomy  Breast Biopsy  left  Review of Systems General Not Present- Chills, Fatigue, Fever, Memory Loss, Night Sweats, Weight Gain and Weight Loss. Skin Not Present- Eczema, Hives, Itching, Lesions and Rash. HEENT Not Present- Dentures, Double Vision, Headache, Hearing Loss, Tinnitus and Visual Loss. Respiratory Not Present- Allergies, Chronic Cough, Coughing up blood, Shortness of breath at rest and Shortness of breath with exertion. Cardiovascular Not Present- Chest Pain, Difficulty Breathing Lying Down, Murmur, Palpitations, Racing/skipping heartbeats and Swelling. Gastrointestinal Not Present- Abdominal Pain, Bloody Stool, Constipation, Diarrhea, Difficulty Swallowing, Heartburn, Jaundice, Loss of appetitie, Nausea and Vomiting. Female Genitourinary Not Present- Blood in Urine, Discharge, Flank Pain, Incontinence, Painful Urination, Urgency, Urinary frequency, Urinary Retention, Urinating at Night and Weak urinary stream. Musculoskeletal Present- Joint Pain, Joint Swelling, Morning Stiffness and Muscle Pain. Not Present- Back Pain, Muscle Weakness and Spasms. Neurological Not Present- Blackout spells, Difficulty with balance, Dizziness, Paralysis, Tremor and Weakness. Psychiatric Not Present-  Insomnia.  Vitals 07/19/2016 2:14 PM Weight: 147 lb Height: 60.25in Body Surface Area: 1.64 m Body Mass Index: 28.47 kg/m  BP: 140/71 (Sitting, Left Arm, Standard)  Physical Exam  General Mental Status -Alert, cooperative and good historian. General Appearance-pleasant, Not in acute distress. Orientation-Oriented X3. Build & Nutrition-Well nourished and Well developed.  Head and Neck Head-normocephalic, atraumatic . Neck Global Assessment - supple, no bruit auscultated on the right, no bruit auscultated on the left.  Eye Pupil - Bilateral-Regular and Round. Motion - Bilateral-EOMI.  Chest and Lung Exam Auscultation Breath sounds - clear at anterior chest wall and clear at posterior chest wall. Adventitious sounds - No Adventitious sounds.  Cardiovascular Auscultation Rhythm - Regular rate and rhythm. Heart Sounds - S1 WNL and S2 WNL. Murmurs & Other Heart Sounds - Auscultation of the heart reveals - No Murmurs.  Abdomen Palpation/Percussion Tenderness - Abdomen is non-tender to palpation. Rigidity (guarding) - Abdomen is soft. Auscultation Auscultation of the abdomen reveals - Bowel sounds normal.  Female Genitourinary Not done, not pertinent to present illness   Musculoskeletal On exam, slight valgus deformity, exquisitely tender in the lateral joint line. She lacks 5 degrees of extension, flexion is only to 100. Mild to moderate effusion. Patellofemoral pain with compression. No DVT. Ipsilateral hip and ankle exams are unremarkable.  Imaging AP, standing, and lateral demonstrates centrally bone-on-bone arthrosis, lateral compartment with slight valgus deformity. Patellofemoral pain.  Assesment & Plan Primary osteoarthritis of right knee (M17.11)  Pt with end-stage right knee DJD, bone-on-bone, refractory to conservative tx, scheduled for right total knee replacement by Dr. on 07/22/16. We again discussed the procedure itself as well as  risks, complications and alternatives, including but not limited to DVT, PE, infx, bleeding, failure of procedure, need for secondary procedure including manipulation, nerve injury, ongoing pain/symptoms, anesthesia risk, even stroke or death. Also discussed typical post-op protocols, activity restrictions, need for PT, flexion/extension exercises, time out of work. Discussed need for DVT ppx post-op with Xarelto per protocol. Discussed dental ppx. Also discussed limitations post-operatively such as kneeling and  squatting. All questions were answered. Patient desires to proceed with surgery as scheduled. Will hold supplements, ASA and NSAIDs accordingly. She will continue with her weekly MTX and will hold Hydroxychloroquine morning of surgery as instructed by pre-op. Will remain NPO after MN night before surgery. Will present to [facility] for pre-op testing. Anticipate hospital stay to include at least 2 midnights given medical history and to ensure proper pain control. Plan Xarelto for DVT ppx post-op as she is unable to tolerate ASA. Plan Norco, Colace, has Miralax at home. Plan home with immediate outpaient PT post-op with family members at home for assistance. Will follow up 10-14 days post-op for suture removal and xrays.  Plan right total knee replacement  Signed electronically by Dorothy Spark, PA-C for Dr. Shelle Iron

## 2016-07-21 NOTE — Anesthesia Preprocedure Evaluation (Addendum)
Anesthesia Evaluation  Patient identified by MRN, date of birth, ID band Patient awake    Reviewed: Allergy & Precautions, H&P , Patient's Chart, lab work & pertinent test results  Airway Mallampati: II  TM Distance: >3 FB Neck ROM: full    Dental no notable dental hx.    Pulmonary former smoker,    Pulmonary exam normal breath sounds clear to auscultation       Cardiovascular Exercise Tolerance: Good hypertension,  Rhythm:regular Rate:Normal     Neuro/Psych    GI/Hepatic   Endo/Other    Renal/GU      Musculoskeletal   Abdominal   Peds  Hematology   Anesthesia Other Findings Hypertension    Asthma due to environmental allergies       RA (rheumatoid arthritis)    Reproductive/Obstetrics                             Anesthesia Physical Anesthesia Plan  ASA: II  Anesthesia Plan: General   Post-op Pain Management:    Induction:   Airway Management Planned: Oral ETT  Additional Equipment:   Intra-op Plan:   Post-operative Plan:   Informed Consent: I have reviewed the patients History and Physical, chart, labs and discussed the procedure including the risks, benefits and alternatives for the proposed anesthesia with the patient or authorized representative who has indicated his/her understanding and acceptance.   Dental Advisory Given  Plan Discussed with: CRNA  Anesthesia Plan Comments: (Pt prefers GA to SAB )       Anesthesia Quick Evaluation

## 2016-07-22 ENCOUNTER — Inpatient Hospital Stay (HOSPITAL_COMMUNITY): Payer: Medicare Other | Admitting: Anesthesiology

## 2016-07-22 ENCOUNTER — Encounter (HOSPITAL_COMMUNITY): Payer: Self-pay | Admitting: *Deleted

## 2016-07-22 ENCOUNTER — Encounter (HOSPITAL_COMMUNITY)
Admission: RE | Disposition: A | Discharge status: Home / Self Care | Payer: Self-pay | Source: Ambulatory Visit | Attending: Specialist

## 2016-07-22 ENCOUNTER — Inpatient Hospital Stay (HOSPITAL_COMMUNITY)
Admission: RE | Admit: 2016-07-22 | Discharge: 2016-07-25 | DRG: 470 | Disposition: A | Discharge status: Home / Self Care | Payer: Medicare Other | Source: Ambulatory Visit | Attending: Specialist | Admitting: Specialist

## 2016-07-22 ENCOUNTER — Inpatient Hospital Stay (HOSPITAL_COMMUNITY): Payer: Medicare Other

## 2016-07-22 DIAGNOSIS — K219 Gastro-esophageal reflux disease without esophagitis: Secondary | ICD-10-CM | POA: Diagnosis present

## 2016-07-22 DIAGNOSIS — Z96659 Presence of unspecified artificial knee joint: Secondary | ICD-10-CM

## 2016-07-22 DIAGNOSIS — I10 Essential (primary) hypertension: Secondary | ICD-10-CM | POA: Diagnosis present

## 2016-07-22 DIAGNOSIS — G8929 Other chronic pain: Secondary | ICD-10-CM | POA: Diagnosis present

## 2016-07-22 DIAGNOSIS — Z87891 Personal history of nicotine dependence: Secondary | ICD-10-CM | POA: Diagnosis not present

## 2016-07-22 DIAGNOSIS — M069 Rheumatoid arthritis, unspecified: Secondary | ICD-10-CM | POA: Diagnosis present

## 2016-07-22 DIAGNOSIS — J45909 Unspecified asthma, uncomplicated: Secondary | ICD-10-CM | POA: Diagnosis present

## 2016-07-22 DIAGNOSIS — M25561 Pain in right knee: Secondary | ICD-10-CM | POA: Diagnosis present

## 2016-07-22 DIAGNOSIS — M797 Fibromyalgia: Secondary | ICD-10-CM | POA: Diagnosis present

## 2016-07-22 DIAGNOSIS — M1711 Unilateral primary osteoarthritis, right knee: Secondary | ICD-10-CM | POA: Diagnosis present

## 2016-07-22 DIAGNOSIS — Z79899 Other long term (current) drug therapy: Secondary | ICD-10-CM

## 2016-07-22 HISTORY — PX: TOTAL KNEE ARTHROPLASTY: SHX125

## 2016-07-22 LAB — TYPE AND SCREEN
ABO/RH(D): A POS
ANTIBODY SCREEN: NEGATIVE

## 2016-07-22 SURGERY — ARTHROPLASTY, KNEE, TOTAL
Anesthesia: General | Site: Knee | Laterality: Right

## 2016-07-22 MED ORDER — MIDAZOLAM HCL 2 MG/2ML IJ SOLN
INTRAMUSCULAR | Status: AC
  Filled 2016-07-22: qty 2

## 2016-07-22 MED ORDER — PROPOFOL 500 MG/50ML IV EMUL
INTRAVENOUS | Status: DC | PRN
  Administered 2016-07-22: 10 ug/kg/min via INTRAVENOUS

## 2016-07-22 MED ORDER — MAGNESIUM OXIDE 400 (241.3 MG) MG PO TABS
800.0000 mg | ORAL_TABLET | Freq: Every day | ORAL | Status: DC
  Administered 2016-07-23 – 2016-07-24 (×2): 800 mg via ORAL
  Filled 2016-07-22 (×3): qty 2

## 2016-07-22 MED ORDER — CEFAZOLIN SODIUM-DEXTROSE 2-4 GM/100ML-% IV SOLN
INTRAVENOUS | Status: AC
  Filled 2016-07-22: qty 100

## 2016-07-22 MED ORDER — DOCUSATE SODIUM 100 MG PO CAPS
100.0000 mg | ORAL_CAPSULE | Freq: Two times a day (BID) | ORAL | Status: DC
  Administered 2016-07-22 – 2016-07-24 (×5): 100 mg via ORAL
  Filled 2016-07-22 (×5): qty 1

## 2016-07-22 MED ORDER — ROCURONIUM BROMIDE 50 MG/5ML IV SOSY
PREFILLED_SYRINGE | INTRAVENOUS | Status: AC
  Filled 2016-07-22: qty 5

## 2016-07-22 MED ORDER — FOLIC ACID 800 MCG PO TABS: 1600.0000 ug | ORAL_TABLET | Freq: Every day | ORAL | Status: DC

## 2016-07-22 MED ORDER — CHLORHEXIDINE GLUCONATE 4 % EX LIQD: 60.0000 mL | Freq: Once | CUTANEOUS | Status: DC

## 2016-07-22 MED ORDER — RIVAROXABAN 10 MG PO TABS
10.0000 mg | ORAL_TABLET | Freq: Every day | ORAL | Status: DC
  Administered 2016-07-23 – 2016-07-25 (×3): 10 mg via ORAL
  Filled 2016-07-22 (×3): qty 1

## 2016-07-22 MED ORDER — BUPIVACAINE HCL (PF) 0.5 % IJ SOLN
INTRAMUSCULAR | Status: AC
  Filled 2016-07-22: qty 30

## 2016-07-22 MED ORDER — ACETAMINOPHEN 500 MG PO TABS: 500.0000 mg | ORAL_TABLET | Freq: Four times a day (QID) | ORAL | Status: DC | PRN

## 2016-07-22 MED ORDER — METHOCARBAMOL 1000 MG/10ML IJ SOLN
500.0000 mg | Freq: Four times a day (QID) | INTRAMUSCULAR | Status: DC | PRN
  Administered 2016-07-22: 500 mg via INTRAVENOUS
  Filled 2016-07-22: qty 5
  Filled 2016-07-22: qty 550

## 2016-07-22 MED ORDER — POLYETHYLENE GLYCOL 3350 17 G PO PACK
17.0000 g | PACK | Freq: Every day | ORAL | Status: DC | PRN
  Administered 2016-07-23: 17 g via ORAL
  Filled 2016-07-22: qty 1

## 2016-07-22 MED ORDER — CEFAZOLIN SODIUM-DEXTROSE 2-4 GM/100ML-% IV SOLN
2.0000 g | Freq: Four times a day (QID) | INTRAVENOUS | Status: AC
  Administered 2016-07-22 (×2): 2 g via INTRAVENOUS
  Filled 2016-07-22 (×2): qty 100

## 2016-07-22 MED ORDER — RIVAROXABAN 10 MG PO TABS: 10.0000 mg | ORAL_TABLET | Freq: Every day | ORAL | 0 refills | Status: DC

## 2016-07-22 MED ORDER — INDAPAMIDE 1.25 MG PO TABS
1.2500 mg | ORAL_TABLET | Freq: Every day | ORAL | Status: DC
  Administered 2016-07-23 – 2016-07-25 (×3): 1.25 mg via ORAL
  Filled 2016-07-22 (×3): qty 1

## 2016-07-22 MED ORDER — MONTELUKAST SODIUM 10 MG PO TABS
10.0000 mg | ORAL_TABLET | Freq: Every day | ORAL | Status: DC
  Administered 2016-07-22 – 2016-07-23 (×2): 10 mg via ORAL
  Filled 2016-07-22 (×3): qty 1

## 2016-07-22 MED ORDER — SODIUM CHLORIDE 0.9 % IR SOLN
Status: AC
  Filled 2016-07-22: qty 500000

## 2016-07-22 MED ORDER — LIDOCAINE-EPINEPHRINE (PF) 1 %-1:200000 IJ SOLN
INTRAMUSCULAR | Status: DC | PRN
  Administered 2016-07-22: 20 mL

## 2016-07-22 MED ORDER — SUGAMMADEX SODIUM 200 MG/2ML IV SOLN
INTRAVENOUS | Status: DC | PRN
  Administered 2016-07-22: 140 mg via INTRAVENOUS

## 2016-07-22 MED ORDER — ROPIVACAINE HCL 7.5 MG/ML IJ SOLN
INTRAMUSCULAR | Status: AC
  Filled 2016-07-22: qty 20

## 2016-07-22 MED ORDER — BISACODYL 5 MG PO TBEC: 5.0000 mg | DELAYED_RELEASE_TABLET | Freq: Every day | ORAL | Status: DC | PRN

## 2016-07-22 MED ORDER — DOCUSATE SODIUM 100 MG PO CAPS: 100.0000 mg | ORAL_CAPSULE | Freq: Two times a day (BID) | ORAL | 1 refills | Status: DC | PRN

## 2016-07-22 MED ORDER — ONDANSETRON HCL 4 MG/2ML IJ SOLN
INTRAMUSCULAR | Status: AC
  Filled 2016-07-22: qty 2

## 2016-07-22 MED ORDER — MAGNESIUM 500 MG PO CAPS: 1000.0000 mg | ORAL_CAPSULE | Freq: Every day | ORAL | Status: DC

## 2016-07-22 MED ORDER — DIPHENHYDRAMINE HCL 12.5 MG/5ML PO ELIX: 12.5000 mg | ORAL_SOLUTION | ORAL | Status: DC | PRN

## 2016-07-22 MED ORDER — FENTANYL CITRATE (PF) 100 MCG/2ML IJ SOLN
INTRAMUSCULAR | Status: DC | PRN
  Administered 2016-07-22 (×2): 50 ug via INTRAVENOUS

## 2016-07-22 MED ORDER — SODIUM CHLORIDE 0.9 % IR SOLN
Status: DC | PRN
  Administered 2016-07-22 (×2): 1000 mL

## 2016-07-22 MED ORDER — FENTANYL CITRATE (PF) 100 MCG/2ML IJ SOLN
INTRAMUSCULAR | Status: AC
  Filled 2016-07-22: qty 2

## 2016-07-22 MED ORDER — HYDROCODONE-ACETAMINOPHEN 5-325 MG PO TABS
1.0000 | ORAL_TABLET | ORAL | Status: DC | PRN
  Administered 2016-07-22 – 2016-07-23 (×3): 1 via ORAL
  Administered 2016-07-23 (×3): 2 via ORAL
  Administered 2016-07-23: 1 via ORAL
  Administered 2016-07-24: 2 via ORAL
  Filled 2016-07-22 (×3): qty 2
  Filled 2016-07-22 (×3): qty 1
  Filled 2016-07-22: qty 2
  Filled 2016-07-22: qty 1

## 2016-07-22 MED ORDER — KETAMINE HCL 10 MG/ML IJ SOLN
INTRAMUSCULAR | Status: DC | PRN
  Administered 2016-07-22: 20 mg via INTRAVENOUS

## 2016-07-22 MED ORDER — LIDOCAINE 2% (20 MG/ML) 5 ML SYRINGE
INTRAMUSCULAR | Status: DC | PRN
  Administered 2016-07-22: 80 mg via INTRAVENOUS

## 2016-07-22 MED ORDER — PROPOFOL 10 MG/ML IV BOLUS
INTRAVENOUS | Status: AC
  Filled 2016-07-22: qty 60

## 2016-07-22 MED ORDER — ACETAMINOPHEN 650 MG RE SUPP: 650.0000 mg | Freq: Four times a day (QID) | RECTAL | Status: DC | PRN

## 2016-07-22 MED ORDER — HYDROCODONE-ACETAMINOPHEN 7.5-325 MG PO TABS: 1.0000 | ORAL_TABLET | ORAL | 0 refills | Status: DC | PRN

## 2016-07-22 MED ORDER — AMLODIPINE BESYLATE 5 MG PO TABS
5.0000 mg | ORAL_TABLET | Freq: Every day | ORAL | Status: DC
  Administered 2016-07-24 – 2016-07-25 (×2): 5 mg via ORAL
  Filled 2016-07-22 (×3): qty 1

## 2016-07-22 MED ORDER — METHOCARBAMOL 500 MG PO TABS
500.0000 mg | ORAL_TABLET | Freq: Four times a day (QID) | ORAL | Status: DC | PRN
  Administered 2016-07-22 – 2016-07-25 (×8): 500 mg via ORAL
  Filled 2016-07-22 (×8): qty 1

## 2016-07-22 MED ORDER — KETAMINE HCL 10 MG/ML IJ SOLN
INTRAMUSCULAR | Status: AC
  Filled 2016-07-22: qty 1

## 2016-07-22 MED ORDER — SODIUM CHLORIDE 0.9 % IR SOLN
Status: DC | PRN
  Administered 2016-07-22: 500 mL

## 2016-07-22 MED ORDER — HYDROMORPHONE HCL 1 MG/ML IJ SOLN
0.5000 mg | INTRAMUSCULAR | Status: DC | PRN
  Administered 2016-07-22 – 2016-07-24 (×8): 0.5 mg via INTRAVENOUS
  Filled 2016-07-22 (×8): qty 0.5

## 2016-07-22 MED ORDER — ALBUTEROL SULFATE HFA 108 (90 BASE) MCG/ACT IN AERS: 2.0000 | INHALATION_SPRAY | Freq: Four times a day (QID) | RESPIRATORY_TRACT | Status: DC | PRN

## 2016-07-22 MED ORDER — DEXAMETHASONE SODIUM PHOSPHATE 10 MG/ML IJ SOLN
INTRAMUSCULAR | Status: DC | PRN
Start: 2016-07-22 — End: 2016-07-22
  Administered 2016-07-22: 10 mg via INTRAVENOUS

## 2016-07-22 MED ORDER — PHENOL 1.4 % MT LIQD: 1.0000 | OROMUCOSAL | Status: DC | PRN

## 2016-07-22 MED ORDER — METOCLOPRAMIDE HCL 5 MG/ML IJ SOLN
INTRAMUSCULAR | Status: AC
  Filled 2016-07-22: qty 4

## 2016-07-22 MED ORDER — FENTANYL CITRATE (PF) 100 MCG/2ML IJ SOLN
25.0000 ug | INTRAMUSCULAR | Status: DC | PRN
  Administered 2016-07-22: 25 ug via INTRAVENOUS

## 2016-07-22 MED ORDER — FAMOTIDINE 20 MG PO TABS
10.0000 mg | ORAL_TABLET | Freq: Two times a day (BID) | ORAL | Status: DC
  Administered 2016-07-22 – 2016-07-25 (×6): 10 mg via ORAL
  Filled 2016-07-22 (×6): qty 1

## 2016-07-22 MED ORDER — CYCLOSPORINE 0.05 % OP EMUL
1.0000 [drp] | Freq: Two times a day (BID) | OPHTHALMIC | Status: DC
  Administered 2016-07-22 – 2016-07-25 (×6): 1 [drp] via OPHTHALMIC
  Filled 2016-07-22 (×6): qty 1

## 2016-07-22 MED ORDER — ZOLPIDEM TARTRATE 5 MG PO TABS
5.0000 mg | ORAL_TABLET | Freq: Every day | ORAL | Status: DC
  Administered 2016-07-22 – 2016-07-24 (×3): 5 mg via ORAL
  Filled 2016-07-22 (×4): qty 1

## 2016-07-22 MED ORDER — EPHEDRINE SULFATE-NACL 50-0.9 MG/10ML-% IV SOSY
PREFILLED_SYRINGE | INTRAVENOUS | Status: DC | PRN
  Administered 2016-07-22 (×2): 5 mg via INTRAVENOUS
  Administered 2016-07-22 (×2): 10 mg via INTRAVENOUS
  Administered 2016-07-22 (×2): 5 mg via INTRAVENOUS

## 2016-07-22 MED ORDER — SUGAMMADEX SODIUM 200 MG/2ML IV SOLN
INTRAVENOUS | Status: AC
  Filled 2016-07-22: qty 2

## 2016-07-22 MED ORDER — KCL IN DEXTROSE-NACL 20-5-0.45 MEQ/L-%-% IV SOLN
INTRAVENOUS | Status: AC
  Administered 2016-07-22: 15:00:00 via INTRAVENOUS
  Filled 2016-07-22 (×2): qty 1000

## 2016-07-22 MED ORDER — DEXAMETHASONE SODIUM PHOSPHATE 10 MG/ML IJ SOLN
INTRAMUSCULAR | Status: AC
  Filled 2016-07-22: qty 1

## 2016-07-22 MED ORDER — SCOPOLAMINE 1 MG/3DAYS TD PT72
MEDICATED_PATCH | TRANSDERMAL | Status: AC
  Filled 2016-07-22: qty 1

## 2016-07-22 MED ORDER — TRANEXAMIC ACID 1000 MG/10ML IV SOLN
1000.0000 mg | INTRAVENOUS | Status: AC
  Administered 2016-07-22: 1000 mg via INTRAVENOUS
  Filled 2016-07-22: qty 1100

## 2016-07-22 MED ORDER — LIDOCAINE 2% (20 MG/ML) 5 ML SYRINGE
INTRAMUSCULAR | Status: AC
  Filled 2016-07-22: qty 5

## 2016-07-22 MED ORDER — ROPIVACAINE HCL 7.5 MG/ML IJ SOLN
INTRAMUSCULAR | Status: DC | PRN
  Administered 2016-07-22: 20 mL via PERINEURAL

## 2016-07-22 MED ORDER — MAGNESIUM CITRATE PO SOLN: 1.0000 | Freq: Once | ORAL | Status: DC | PRN

## 2016-07-22 MED ORDER — LIDOCAINE-EPINEPHRINE (PF) 1 %-1:200000 IJ SOLN
INTRAMUSCULAR | Status: AC
  Filled 2016-07-22: qty 30

## 2016-07-22 MED ORDER — PROPOFOL 10 MG/ML IV BOLUS
INTRAVENOUS | Status: DC | PRN
Start: 2016-07-22 — End: 2016-07-22
  Administered 2016-07-22: 150 mg via INTRAVENOUS

## 2016-07-22 MED ORDER — ONDANSETRON HCL 4 MG/2ML IJ SOLN
INTRAMUSCULAR | Status: DC | PRN
  Administered 2016-07-22: 4 mg via INTRAVENOUS

## 2016-07-22 MED ORDER — ALBUTEROL SULFATE (2.5 MG/3ML) 0.083% IN NEBU: 2.5000 mg | INHALATION_SOLUTION | Freq: Four times a day (QID) | RESPIRATORY_TRACT | Status: DC | PRN

## 2016-07-22 MED ORDER — HYDROMORPHONE HCL 2 MG/ML IJ SOLN
INTRAMUSCULAR | Status: AC
  Filled 2016-07-22: qty 1

## 2016-07-22 MED ORDER — POTASSIUM CHLORIDE ER 10 MEQ PO TBCR
10.0000 meq | EXTENDED_RELEASE_TABLET | Freq: Three times a day (TID) | ORAL | Status: DC
  Administered 2016-07-22 – 2016-07-25 (×9): 10 meq via ORAL
  Filled 2016-07-22 (×19): qty 1

## 2016-07-22 MED ORDER — SODIUM CHLORIDE 0.9 % IJ SOLN
INTRAMUSCULAR | Status: AC
  Filled 2016-07-22: qty 10

## 2016-07-22 MED ORDER — FOLIC ACID 1 MG PO TABS
1.0000 mg | ORAL_TABLET | Freq: Every day | ORAL | Status: DC
  Administered 2016-07-23 – 2016-07-25 (×3): 1 mg via ORAL
  Filled 2016-07-22 (×3): qty 1

## 2016-07-22 MED ORDER — MENTHOL 3 MG MT LOZG
1.0000 | LOZENGE | OROMUCOSAL | Status: DC | PRN
Start: 2016-07-22 — End: 2016-07-25

## 2016-07-22 MED ORDER — ONDANSETRON HCL 4 MG PO TABS: 4.0000 mg | ORAL_TABLET | Freq: Four times a day (QID) | ORAL | Status: DC | PRN

## 2016-07-22 MED ORDER — ACETAMINOPHEN 325 MG PO TABS: 650.0000 mg | ORAL_TABLET | Freq: Four times a day (QID) | ORAL | Status: DC | PRN

## 2016-07-22 MED ORDER — MIDAZOLAM HCL 5 MG/5ML IJ SOLN
INTRAMUSCULAR | Status: DC | PRN
  Administered 2016-07-22: 2 mg via INTRAVENOUS

## 2016-07-22 MED ORDER — HYDROMORPHONE HCL 1 MG/ML IJ SOLN
INTRAMUSCULAR | Status: DC | PRN
  Administered 2016-07-22 (×2): .4 mg via INTRAVENOUS

## 2016-07-22 MED ORDER — METHOTREXATE 2.5 MG PO TABS
25.0000 mg | ORAL_TABLET | ORAL | Status: DC
  Administered 2016-07-25: 25 mg via ORAL
  Filled 2016-07-22: qty 10

## 2016-07-22 MED ORDER — STERILE WATER FOR IRRIGATION IR SOLN
Status: DC | PRN
  Administered 2016-07-22: 2000 mL

## 2016-07-22 MED ORDER — CLINDAMYCIN PHOSPHATE 900 MG/50ML IV SOLN
900.0000 mg | INTRAVENOUS | Status: AC
  Administered 2016-07-22: 900 mg via INTRAVENOUS

## 2016-07-22 MED ORDER — METOCLOPRAMIDE HCL 5 MG/ML IJ SOLN: 5.0000 mg | Freq: Three times a day (TID) | INTRAMUSCULAR | Status: DC | PRN

## 2016-07-22 MED ORDER — ONDANSETRON HCL 4 MG/2ML IJ SOLN
4.0000 mg | Freq: Four times a day (QID) | INTRAMUSCULAR | Status: DC | PRN
  Administered 2016-07-22: 15:00:00 4 mg via INTRAVENOUS
  Filled 2016-07-22: qty 2

## 2016-07-22 MED ORDER — LACTATED RINGERS IV SOLN
INTRAVENOUS | Status: DC | PRN
  Administered 2016-07-22 (×2): via INTRAVENOUS

## 2016-07-22 MED ORDER — CLINDAMYCIN PHOSPHATE 900 MG/50ML IV SOLN
INTRAVENOUS | Status: AC
  Filled 2016-07-22: qty 50

## 2016-07-22 MED ORDER — ALUM & MAG HYDROXIDE-SIMETH 200-200-20 MG/5ML PO SUSP: 30.0000 mL | ORAL | Status: DC | PRN

## 2016-07-22 MED ORDER — METOCLOPRAMIDE HCL 5 MG PO TABS: 5.0000 mg | ORAL_TABLET | Freq: Three times a day (TID) | ORAL | Status: DC | PRN

## 2016-07-22 MED ORDER — SCOPOLAMINE 1 MG/3DAYS TD PT72
MEDICATED_PATCH | TRANSDERMAL | Status: DC | PRN
  Administered 2016-07-22: 1 via TRANSDERMAL

## 2016-07-22 MED ORDER — RISAQUAD PO CAPS
1.0000 | ORAL_CAPSULE | Freq: Every day | ORAL | Status: DC
  Administered 2016-07-22 – 2016-07-25 (×4): 1 via ORAL
  Filled 2016-07-22 (×4): qty 1

## 2016-07-22 MED ORDER — EPHEDRINE 5 MG/ML INJ
INTRAVENOUS | Status: AC
  Filled 2016-07-22: qty 10

## 2016-07-22 MED ORDER — ROCURONIUM BROMIDE 10 MG/ML (PF) SYRINGE
PREFILLED_SYRINGE | INTRAVENOUS | Status: DC | PRN
  Administered 2016-07-22: 50 mg via INTRAVENOUS

## 2016-07-22 MED ORDER — TRAMADOL HCL 50 MG PO TABS
50.0000 mg | ORAL_TABLET | Freq: Four times a day (QID) | ORAL | Status: DC | PRN
  Administered 2016-07-23 – 2016-07-25 (×4): 50 mg via ORAL
  Filled 2016-07-22 (×4): qty 1

## 2016-07-22 MED ORDER — CEFAZOLIN SODIUM-DEXTROSE 2-4 GM/100ML-% IV SOLN
2.0000 g | INTRAVENOUS | Status: AC
  Administered 2016-07-22: 2 g via INTRAVENOUS

## 2016-07-22 SURGICAL SUPPLY — 61 items
BAG ZIPLOCK 12X15 (MISCELLANEOUS) ×3 IMPLANT
BANDAGE ACE 4X5 VEL STRL LF (GAUZE/BANDAGES/DRESSINGS) ×3 IMPLANT
BANDAGE ACE 6X5 VEL STRL LF (GAUZE/BANDAGES/DRESSINGS) ×3 IMPLANT
BLADE SAG 18X100X1.27 (BLADE) ×3 IMPLANT
BLADE SAW SGTL 13.0X1.19X90.0M (BLADE) ×3 IMPLANT
CAPT KNEE TOTAL 3 ATTUNE ×3 IMPLANT
CEMENT HV SMART SET (Cement) ×6 IMPLANT
CLOSURE WOUND 1/2 X4 (GAUZE/BANDAGES/DRESSINGS) ×2
CLOTH 2% CHLOROHEXIDINE 3PK (PERSONAL CARE ITEMS) ×3 IMPLANT
CUFF TOURN SGL QUICK 34 (TOURNIQUET CUFF) ×2
CUFF TRNQT CYL 34X4X40X1 (TOURNIQUET CUFF) ×1 IMPLANT
DECANTER SPIKE VIAL GLASS SM (MISCELLANEOUS) ×3 IMPLANT
DRAPE INCISE IOBAN 66X45 STRL (DRAPES) IMPLANT
DRAPE ORTHO SPLIT 77X108 STRL (DRAPES) ×4
DRAPE SHEET LG 3/4 BI-LAMINATE (DRAPES) ×6 IMPLANT
DRAPE SURG ORHT 6 SPLT 77X108 (DRAPES) ×2 IMPLANT
DRAPE U-SHAPE 47X51 STRL (DRAPES) ×3 IMPLANT
DRSG AQUACEL AG ADV 3.5X10 (GAUZE/BANDAGES/DRESSINGS) ×3 IMPLANT
DRSG TEGADERM 4X4.75 (GAUZE/BANDAGES/DRESSINGS) IMPLANT
DURAPREP 26ML APPLICATOR (WOUND CARE) ×3 IMPLANT
ELECT REM PT RETURN 9FT ADLT (ELECTROSURGICAL) ×3
ELECTRODE REM PT RTRN 9FT ADLT (ELECTROSURGICAL) ×1 IMPLANT
EVACUATOR 1/8 PVC DRAIN (DRAIN) IMPLANT
GAUZE SPONGE 2X2 8PLY STRL LF (GAUZE/BANDAGES/DRESSINGS) IMPLANT
GLOVE BIOGEL PI IND STRL 7.0 (GLOVE) ×1 IMPLANT
GLOVE BIOGEL PI IND STRL 8 (GLOVE) ×1 IMPLANT
GLOVE BIOGEL PI INDICATOR 7.0 (GLOVE) ×2
GLOVE BIOGEL PI INDICATOR 8 (GLOVE) ×2
GLOVE SURG SS PI 7.0 STRL IVOR (GLOVE) ×3 IMPLANT
GLOVE SURG SS PI 7.5 STRL IVOR (GLOVE) IMPLANT
GLOVE SURG SS PI 8.0 STRL IVOR (GLOVE) ×3 IMPLANT
GOWN STRL REUS W/TWL XL LVL3 (GOWN DISPOSABLE) ×6 IMPLANT
HANDPIECE INTERPULSE COAX TIP (DISPOSABLE) ×2
HEMOSTAT SPONGE AVITENE ULTRA (HEMOSTASIS) IMPLANT
IMMOBILIZER KNEE 20 (SOFTGOODS) ×3
IMMOBILIZER KNEE 20 THIGH 36 (SOFTGOODS) ×1 IMPLANT
MANIFOLD NEPTUNE II (INSTRUMENTS) ×3 IMPLANT
NS IRRIG 1000ML POUR BTL (IV SOLUTION) IMPLANT
PACK TOTAL KNEE CUSTOM (KITS) ×3 IMPLANT
POSITIONER SURGICAL ARM (MISCELLANEOUS) ×3 IMPLANT
SET HNDPC FAN SPRY TIP SCT (DISPOSABLE) ×1 IMPLANT
SPONGE GAUZE 2X2 STER 10/PKG (GAUZE/BANDAGES/DRESSINGS)
SPONGE SURGIFOAM ABS GEL 100 (HEMOSTASIS) IMPLANT
SPONGE SURGIFOAM ABS GEL 12-7 (HEMOSTASIS) ×3 IMPLANT
STAPLER VISISTAT (STAPLE) IMPLANT
STRIP CLOSURE SKIN 1/2X4 (GAUZE/BANDAGES/DRESSINGS) ×4 IMPLANT
SUT BONE WAX W31G (SUTURE) ×3 IMPLANT
SUT MNCRL AB 4-0 PS2 18 (SUTURE) ×3 IMPLANT
SUT STRATAFIX 0 PDS 27 VIOLET (SUTURE) ×3
SUT VIC AB 1 CT1 27 (SUTURE) ×4
SUT VIC AB 1 CT1 27XBRD ANTBC (SUTURE) ×2 IMPLANT
SUT VIC AB 2-0 CT1 27 (SUTURE) ×6
SUT VIC AB 2-0 CT1 TAPERPNT 27 (SUTURE) ×3 IMPLANT
SUTURE STRATFX 0 PDS 27 VIOLET (SUTURE) ×1 IMPLANT
SYR 50ML LL SCALE MARK (SYRINGE) IMPLANT
TOWER CARTRIDGE SMART MIX (DISPOSABLE) ×3 IMPLANT
TRAY FOLEY CATH 14FRSI W/METER (CATHETERS) ×3 IMPLANT
TRAY FOLEY W/METER SILVER 16FR (SET/KITS/TRAYS/PACK) IMPLANT
WATER STERILE IRR 1500ML POUR (IV SOLUTION) ×6 IMPLANT
WRAP KNEE MAXI GEL POST OP (GAUZE/BANDAGES/DRESSINGS) ×3 IMPLANT
YANKAUER SUCT BULB TIP 10FT TU (MISCELLANEOUS) ×3 IMPLANT

## 2016-07-22 NOTE — Anesthesia Procedure Notes (Signed)
Anesthesia Regional Block:  Adductor canal block  Pre-Anesthetic Checklist: ,, timeout performed, Correct Patient, Correct Site, Correct Laterality, Correct Procedure, Correct Position, site marked, Risks and benefits discussed, pre-op evaluation,  At surgeon's request and post-op pain management  Laterality: Right  Prep: chloraprep       Needles:   Needle Type: Echogenic Needle     Needle Length: 9cm 9 cm Needle Gauge: 21 and 21 G    Additional Needles:  Procedures: ultrasound guided (picture in chart) and other Adductor canal block Narrative:  Start time: 07/22/2016 6:55 AM End time: 07/22/2016 7:05 AM Anesthesiologist: Cristela Blue  Additional Notes: 20cc .75% Naropin

## 2016-07-22 NOTE — Progress Notes (Signed)
X-RAY results noted 

## 2016-07-22 NOTE — Anesthesia Postprocedure Evaluation (Signed)
Anesthesia Post Note  Patient: [name]  Procedure(s) Performed: Procedure(s) (LRB): Right TOTAL KNEE ARTHROPLASTY (Right)  Patient location during evaluation: PACU Anesthesia Type: General Level of consciousness: sedated Pain management: satisfactory to patient Vital Signs Assessment: post-procedure vital signs reviewed and stable Respiratory status: spontaneous breathing Cardiovascular status: stable Anesthetic complications: no       Last Vitals:  Vitals:   07/22/16 1200 07/22/16 1300  BP: 125/60 (!) 126/59  Pulse: 81 81  Resp: 16 16  Temp: 36.3 C 36.4 C    Last Pain:  Vitals:   07/22/16 1045  TempSrc:   PainSc: 3                  Namir Neto EDWARD

## 2016-07-22 NOTE — Anesthesia Procedure Notes (Signed)
Procedure Name: Intubation Date/Time: 07/22/2016 7:30 AM Performed by: Orien Mayhall, Virgel Gess Pre-anesthesia Checklist: Patient identified, Emergency Drugs available, Suction available, Patient being monitored and Timeout performed Patient Re-evaluated:Patient Re-evaluated prior to inductionOxygen Delivery Method: Circle system utilized Preoxygenation: Pre-oxygenation with 100% oxygen Intubation Type: IV induction Ventilation: Mask ventilation without difficulty Laryngoscope Size: Mac and 4 Grade View: Grade I Tube type: Oral Tube size: 7.5 mm Number of attempts: 1 Airway Equipment and Method: Stylet Placement Confirmation: ETT inserted through vocal cords under direct vision,  positive ETCO2,  CO2 detector and breath sounds checked- equal and bilateral Secured at: 22 cm Tube secured with: Tape Dental Injury: Teeth and Oropharynx as per pre-operative assessment

## 2016-07-22 NOTE — H&P (View-Only) (Signed)
[name] DOB: 04/27/1951 Married / Language: English / Race: White Female  H&P Date: 07/19/16  Chief Complaint: Right knee pain  History of Present Illness The patient is a 65 year old female who comes in today for a preoperative History and Physical. The patient is scheduled for a right total knee arthroplasty to be performed by Dr. Jeffrey C. Beane, MD at [facility] on 07/22/2016. [name] reports chronic R knee pain progressively worsening for years duration at this point, with hx of prior arthroscopy. Pain refractory to cortisone and viscosupplementation injections, activity modifications, quad strengthening, ice, elevation, NSAIDs, medications, relative rest. At this point pain is interfering with her ADLs and quality of life and she desires to proceed with surgery. She does have a history of RA and is on MTX and hydroxychloroquine.  Dr. Beane and the patient mutually agreed to proceed with a Right total knee replacement. Risks and benefits of the procedure were discussed including stiffness, suboptimal range of motion, persistent pain, infection requiring removal of prosthesis and reinsertion, need for prophylactic antibiotics in the future, for example, dental procedures, possible need for manipulation, revision in the future and also anesthetic complications including DVT, PE, etc. We discussed the perioperative course, time in the hospital, postoperative recovery and the need for elevation to control swelling. We also discussed the predicted range of motion and the probability that squatting and kneeling would be unobtainable in the future. In addition, postoperative anticoagulation was discussed. We have obtained preoperative medical clearance as necessary (Dr. Hawks and Dr. Wilson). Provided her illustrated handout and discussed it in detail. They will enroll in the total joint replacement educational forum at the hospital.  She is unable to tolerate ASA (allergy- one side of her  body goes numb). She has had both Hydrocodone and Oxycodone for pain in the past and they were ineffective (for nerve pain)- Hydrocodone was effective for shoulder surgery pain. She did not have an adverse event with either medication and does not recall N/V.  She completed her pre-op appt at [facility] last week and was staph positive but MRSA negative. She denies hx of DVT.  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)  Lumbar radiculopathy (M54.16)  Traumatic ecchymosis of right hand, initial encounter (S60.221A)  Degenerative lumbar disc (M51.36)  Primary osteoarthritis of left knee (M17.12)  Spinal stenosis, lumbar (M48.061)  Spondylolisthesis, lumbar region (M43.16)  Acquired Metacarpophalangeal joint pain of right hand (M79.641)  Migraine Headache  Allergic Urticaria  Rheumatoid Arthritis  Ulcer disease  Fibromyalgia  Osteoarthritis  Gastroesophageal Reflux Disease  Chronic Pain  Kidney Stone  Asthma  High blood pressure   Allergies Methadose *ANALGESICS - OPIOID*  Pamelor *ANTIDEPRESSANTS*  Aspirin *ANALGESICS - NonNarcotic*  one side of body goes numb SULFA [04/08/2004]: anti-inflammatories [04/08/2004]:  Family History Cerebrovascular Accident  Father. Hypertension  Brother, Father, Mother, Sister. Diabetes Mellitus  Father, Sister. Heart Disease  Father, Mother. Osteoarthritis  Mother. First Degree Relatives   Social History  Tobacco use  Former smoker. 08/03/2013: smoke(d) 1/2 pack(s) per day Current work status  retired Never consumed alcohol  08/03/2013: Never consumed alcohol Children  2 Tobacco / smoke exposure  08/03/2013: no Living situation  live with spouse Number of flights of stairs before winded  2-3 No history of drug/alcohol rehab  Marital status  married Exercise   Exercises daily; does running / walking and team sport Not   under pain contract   Medication History Methotrexate Sodium (2.5MG  Tablet, 10 Oral once a week) Active. Amlodipine 5mg  Active. Indapamide (1.25MG  Tablet, Oral) Active. Potassium Chloride ER ( Tablet ER, Oral) Active. Zolpidem Tartrate (10MG  Tablet, Oral) Active. Folic Acid (1MG  Tablet, Oral) Active. Hydroxychloroquine Sulfate (200MG  Tablet, Oral) Active. Vitamin D (2000UNIT Tablet, Oral) Active. Feverfew (Oral) Specific strength unknown - Active. Medications Reconciled  Past Surgical History Foot Surgery  bilateral Hysterectomy  partial (non-cancerous) Rotator Cuff Repair  bilateral Appendectomy  Breast Biopsy  left  Review of Systems General Not Present- Chills, Fatigue, Fever, Memory Loss, Night Sweats, Weight Gain and Weight Loss. Skin Not Present- Eczema, Hives, Itching, Lesions and Rash. HEENT Not Present- Dentures, Double Vision, Headache, Hearing Loss, Tinnitus and Visual Loss. Respiratory Not Present- Allergies, Chronic Cough, Coughing up blood, Shortness of breath at rest and Shortness of breath with exertion. Cardiovascular Not Present- Chest Pain, Difficulty Breathing Lying Down, Murmur, Palpitations, Racing/skipping heartbeats and Swelling. Gastrointestinal Not Present- Abdominal Pain, Bloody Stool, Constipation, Diarrhea, Difficulty Swallowing, Heartburn, Jaundice, Loss of appetitie, Nausea and Vomiting. Female Genitourinary Not Present- Blood in Urine, Discharge, Flank Pain, Incontinence, Painful Urination, Urgency, Urinary frequency, Urinary Retention, Urinating at Night and Weak urinary stream. Musculoskeletal Present- Joint Pain, Joint Swelling, Morning Stiffness and Muscle Pain. Not Present- Back Pain, Muscle Weakness and Spasms. Neurological Not Present- Blackout spells, Difficulty with balance, Dizziness, Paralysis, Tremor and Weakness. Psychiatric Not Present-  Insomnia.  Vitals 07/19/2016 2:14 PM Weight: 147 lb Height: 60.25in Body Surface Area: 1.64 m Body Mass Index: 28.47 kg/m  BP: 140/71 (Sitting, Left Arm, Standard)  Physical Exam  General Mental Status -Alert, cooperative and good historian. General Appearance-pleasant, Not in acute distress. Orientation-Oriented X3. Build & Nutrition-Well nourished and Well developed.  Head and Neck Head-normocephalic, atraumatic . Neck Global Assessment - supple, no bruit auscultated on the right, no bruit auscultated on the left.  Eye Pupil - Bilateral-Regular and Round. Motion - Bilateral-EOMI.  Chest and Lung Exam Auscultation Breath sounds - clear at anterior chest wall and clear at posterior chest wall. Adventitious sounds - No Adventitious sounds.  Cardiovascular Auscultation Rhythm - Regular rate and rhythm. Heart Sounds - S1 WNL and S2 WNL. Murmurs & Other Heart Sounds - Auscultation of the heart reveals - No Murmurs.  Abdomen Palpation/Percussion Tenderness - Abdomen is non-tender to palpation. Rigidity (guarding) - Abdomen is soft. Auscultation Auscultation of the abdomen reveals - Bowel sounds normal.  Female Genitourinary Not done, not pertinent to present illness   Musculoskeletal On exam, slight valgus deformity, exquisitely tender in the lateral joint line. She lacks 5 degrees of extension, flexion is only to 100. Mild to moderate effusion. Patellofemoral pain with compression. No DVT. Ipsilateral hip and ankle exams are unremarkable.  Imaging AP, standing, and lateral demonstrates centrally bone-on-bone arthrosis, lateral compartment with slight valgus deformity. Patellofemoral pain.  Assesment & Plan Primary osteoarthritis of right knee (M17.11)  Pt with end-stage right knee DJD, bone-on-bone, refractory to conservative tx, scheduled for right total knee replacement by Dr. on 07/22/16. We again discussed the procedure itself as well as  risks, complications and alternatives, including but not limited to DVT, PE, infx, bleeding, failure of procedure, need for secondary procedure including manipulation, nerve injury, ongoing pain/symptoms, anesthesia risk, even stroke or death. Also discussed typical post-op protocols, activity restrictions, need for PT, flexion/extension exercises, time out of work. Discussed need for DVT ppx post-op with Xarelto per protocol. Discussed dental ppx. Also discussed limitations post-operatively such as kneeling and  squatting. All questions were answered. Patient desires to proceed with surgery as scheduled. Will hold supplements, ASA and NSAIDs accordingly. She will continue with her weekly MTX and will hold Hydroxychloroquine morning of surgery as instructed by pre-op. Will remain NPO after MN night before surgery. Will present to [facility] for pre-op testing. Anticipate hospital stay to include at least 2 midnights given medical history and to ensure proper pain control. Plan Xarelto for DVT ppx post-op as she is unable to tolerate ASA. Plan Norco, Colace, has Miralax at home. Plan home with immediate outpaient PT post-op with family members at home for assistance. Will follow up 10-14 days post-op for suture removal and xrays.  Plan right total knee replacement  Signed electronically by Dorothy Spark, PA-C for Dr. Shelle Iron

## 2016-07-22 NOTE — Interval H&P Note (Signed)
History and Physical Interval Note:  07/22/2016 7:00 AM  [name]  has presented today for surgery, with the diagnosis of Rt knee Degenerative joint Disease  The various methods of treatment have been discussed with the patient and family. After consideration of risks, benefits and other options for treatment, the patient has consented to  Procedure(s) with comments: Right TOTAL KNEE ARTHROPLASTY (Right) - Requests 2 hours as a surgical intervention .  The patient's history has been reviewed, patient examined, no change in status, stable for surgery.  I have reviewed the patient's chart and labs.  Questions were answered to the patient's satisfaction.     Tynasia Mccaul C

## 2016-07-22 NOTE — Brief Op Note (Signed)
07/22/2016  9:02 AM  PATIENT:  [name]  66 y.o. female  PRE-OPERATIVE DIAGNOSIS:  Rt knee Degenerative joint Disease  POST-OPERATIVE DIAGNOSIS:  Rt knee Degenerative joint Disease  PROCEDURE:  Procedure(s) with comments: Right TOTAL KNEE ARTHROPLASTY (Right) - Requests 2 hours  SURGEON:  Surgeon(s) and Role:    * Jene Every, MD - Primary  PHYSICIAN ASSISTANT:   ASSISTANTS: Bissell   ANESTHESIA:   general  EBL:  Total I/O In: 1000 [I.V.:1000] Out: -   BLOOD ADMINISTERED:none  DRAINS: none   LOCAL MEDICATIONS USED:  LIDOCAINE   SPECIMEN:  No Specimen  DISPOSITION OF SPECIMEN:  N/A  COUNTS:  YES  TOURNIQUET:   Total Tourniquet Time Documented: Thigh (Right) - 57 minutes Total: Thigh (Right) - 57 minutes   DICTATION: .Other Dictation: Dictation Number [id number]  PLAN OF CARE: Admit to inpatient   PATIENT DISPOSITION:  PACU - hemodynamically stable.   Delay start of Pharmacological VTE agent (>24hrs) due to surgical blood loss or risk of bleeding: no

## 2016-07-22 NOTE — Progress Notes (Signed)
Portable AP and Lateral Right Knee X-rays done. 

## 2016-07-22 NOTE — Progress Notes (Signed)
CSW consulted for SNF placement. PN reviewed. PT has recommended HPT at d/c. RNCM will assist with d/c planning needs.  Cori Razor LCSW [phone]

## 2016-07-22 NOTE — Transfer of Care (Signed)
Immediate Anesthesia Transfer of Care Note  Patient: [name]  Procedure(s) Performed: Procedure(s) with comments: Right TOTAL KNEE ARTHROPLASTY (Right) - Requests 2 hours  Patient Location: PACU  Anesthesia Type:General  Level of Consciousness:  sedated, patient cooperative and responds to stimulation  Airway & Oxygen Therapy:Patient Spontanous Breathing and Patient connected to face mask oxgen  Post-op Assessment:  Report given to PACU RN and Post -op Vital signs reviewed and stable  Post vital signs:  Reviewed and stable  Last Vitals:  Vitals:   07/22/16 0526  BP: (!) 154/84  Pulse: 80  Resp: 16  Temp: 36.7 C    Complications: No apparent anesthesia complications

## 2016-07-22 NOTE — Evaluation (Signed)
Physical Therapy Evaluation Patient Details Name: [name] MRN: [id number] DOB: 1951/03/16 Today's Date: 07/22/2016   History of Present Illness  Pt s/p R TKR  Clinical Impression  Pt s.p R TKR presents with decreased R LE strength/ROM and post op pain limiting functional mobility.  Pt should progress well to dc home with family assist and follow up OP PT.    Follow Up Recommendations Outpatient PT    Equipment Recommendations  Rolling walker with 5" wheels (Pt is 5'0)    Recommendations for Other Services OT consult     Precautions / Restrictions Precautions Precautions: Knee;Fall Required Braces or Orthoses: Knee Immobilizer - Right Restrictions Weight Bearing Restrictions: No Other Position/Activity Restrictions: WBAT      Mobility  Bed Mobility Overal bed mobility: Needs Assistance Bed Mobility: Supine to Sit     Supine to sit: Min assist     General bed mobility comments: cues for sequence and use of L LE to self assist  Transfers Overall transfer level: Needs assistance Equipment used: Rolling walker (2 wheeled) Transfers: Sit to/from Stand Sit to Stand: Min assist         General transfer comment: cues for LE management and use of UEs to self assist  Ambulation/Gait Ambulation/Gait assistance: Min assist Ambulation Distance (Feet): 34 Feet Assistive device: Rolling walker (2 wheeled) Gait Pattern/deviations: Step-to pattern;Decreased step length - right;Decreased step length - left;Shuffle Gait velocity: decr Gait velocity interpretation: Below normal speed for age/gender General Gait Details: cues for sequence, posture and position from [employer]            Wheelchair Mobility    Modified Rankin (Stroke Patients Only)       Balance Overall balance assessment: No apparent balance deficits (not formally assessed)                                           Pertinent Vitals/Pain Pain Assessment: 0-10 Pain  Score: 1  Pain Location: R knee Pain Descriptors / Indicators: Aching;Sore Pain Intervention(s): Limited activity within patient's tolerance;Monitored during session;Premedicated before session;Ice applied    Home Living Family/patient expects to be discharged to:: Private residence Living Arrangements: Spouse/significant other Available Help at Discharge: Family Type of Home: House Home Access: Stairs to enter   Secretary/administrator of Steps: 1 Home Layout: One level Home Equipment: Environmental consultant - 4 wheels      Prior Function Level of Independence: Independent               Hand Dominance        Extremity/Trunk Assessment   Upper Extremity Assessment Upper Extremity Assessment: Overall WFL for tasks assessed    Lower Extremity Assessment Lower Extremity Assessment: RLE deficits/detail    Cervical / Trunk Assessment Cervical / Trunk Assessment: Normal  Communication   Communication: No difficulties  Cognition Arousal/Alertness: Awake/alert Behavior During Therapy: WFL for tasks assessed/performed Overall Cognitive Status: Within Functional Limits for tasks assessed                      General Comments      Exercises Total Joint Exercises Ankle Circles/Pumps: AROM;Both;15 reps;Supine   Assessment/Plan    PT Assessment Patient needs continued PT services  PT Problem List Decreased strength;Decreased range of motion;Decreased activity tolerance;Decreased balance;Decreased mobility;Decreased knowledge of use of DME;Pain  PT Treatment Interventions DME instruction;Gait training;Stair training;Functional mobility training;Therapeutic activities;Therapeutic exercise;Patient/family education    PT Goals (Current goals can be found in the Care Plan section)  Acute Rehab PT Goals Patient Stated Goal: Regain IND PT Goal Formulation: With patient Time For Goal Achievement: 07/26/16 Potential to Achieve Goals: Good    Frequency 7X/week    Barriers to discharge        Co-evaluation               End of Session Equipment Utilized During Treatment: Gait belt;Right knee immobilizer Activity Tolerance: Patient tolerated treatment well Patient left: in chair;with call bell/phone within reach;with family/visitor present Nurse Communication: Mobility status         Time: 1523-1550 PT Time Calculation (min) (ACUTE ONLY): 27 min   Charges:   PT Evaluation $PT Eval Low Complexity: 1 Procedure PT Treatments $Gait Training: 8-22 mins   PT G Codes:        Ellice Boultinghouse 04-Aug-2016, 4:09 PM

## 2016-07-23 ENCOUNTER — Encounter (HOSPITAL_COMMUNITY): Payer: Self-pay | Admitting: Specialist

## 2016-07-23 LAB — BASIC METABOLIC PANEL
Anion gap: 6 (ref 5–15)
BUN: 11 mg/dL (ref 6–20)
CALCIUM: 8.8 mg/dL — AB (ref 8.9–10.3)
CO2: 29 mmol/L (ref 22–32)
CREATININE: 0.82 mg/dL (ref 0.44–1.00)
Chloride: 101 mmol/L (ref 101–111)
Glucose, Bld: 140 mg/dL — ABNORMAL HIGH (ref 65–99)
Potassium: 3.5 mmol/L (ref 3.5–5.1)
SODIUM: 136 mmol/L (ref 135–145)

## 2016-07-23 LAB — CBC
HCT: 32 % — ABNORMAL LOW (ref 36.0–46.0)
Hemoglobin: 11.1 g/dL — ABNORMAL LOW (ref 12.0–15.0)
MCH: 33.3 pg (ref 26.0–34.0)
MCHC: 34.7 g/dL (ref 30.0–36.0)
MCV: 96.1 fL (ref 78.0–100.0)
PLATELETS: 241 10*3/uL (ref 150–400)
RBC: 3.33 MIL/uL — ABNORMAL LOW (ref 3.87–5.11)
RDW: 15 % (ref 11.5–15.5)
WBC: 11.2 10*3/uL — ABNORMAL HIGH (ref 4.0–10.5)

## 2016-07-23 NOTE — Progress Notes (Addendum)
[facility] RW ordered for pt.  Pt will follow up with out pt PT.

## 2016-07-23 NOTE — Progress Notes (Signed)
Patient ID: [name], female   DOB: [birth year], 66 y.o.   MRN: [id number] Subjective: 1 Day Post-Op Procedure(s) (LRB): Right TOTAL KNEE ARTHROPLASTY (Right) Patient reports pain as moderate.    Patient has complaints of R knee pain. We will start therapy today. Plan is to go home with HHPT after hospital stay. No other c/o this AM. Tolerating Norco well, no N/V.  Objective: Vital signs in last 24 hours: Temp:  [97.4 F (36.3 C)-99.1 F (37.3 C)] 98.3 F (36.8 C) (01/26 0553) Pulse Rate:  [79-98] 79 (01/26 0553) Resp:  [9-16] 16 (01/26 0553) BP: (112-145)/(56-75) 133/56 (01/26 0553) SpO2:  [95 %-100 %] 96 % (01/26 0553)  Intake/Output from previous day:  Intake/Output Summary (Last 24 hours) at 07/23/16 0751 Last data filed at 07/23/16 0553  Gross per 24 hour  Intake          2096.67 ml  Output             3950 ml  Net         -1853.33 ml    Intake/Output this shift: No intake/output data recorded.  Labs: Results for orders placed or performed during the hospital encounter of 07/22/16  CBC  Result Value Ref Range   WBC 11.2 (H) 4.0 - 10.5 K/uL   RBC 3.33 (L) 3.87 - 5.11 MIL/uL   Hemoglobin 11.1 (L) 12.0 - 15.0 g/dL   HCT 31.4 (L) 97.0 - 26.3 %   MCV 96.1 78.0 - 100.0 fL   MCH 33.3 26.0 - 34.0 pg   MCHC 34.7 30.0 - 36.0 g/dL   RDW 78.5 88.5 - 02.7 %   Platelets 241 150 - 400 K/uL  Basic metabolic panel  Result Value Ref Range   Sodium 136 135 - 145 mmol/L   Potassium 3.5 3.5 - 5.1 mmol/L   Chloride 101 101 - 111 mmol/L   CO2 29 22 - 32 mmol/L   Glucose, Bld 140 (H) 65 - 99 mg/dL   BUN 11 6 - 20 mg/dL   Creatinine, Ser 7.41 0.44 - 1.00 mg/dL   Calcium 8.8 (L) 8.9 - 10.3 mg/dL   GFR calc non Af Amer >60 >60 mL/min   GFR calc Af Amer >60 >60 mL/min   Anion gap 6 5 - 15    Exam - Neurologically intact ABD soft Neurovascular intact Sensation intact distally Intact pulses distally Dorsiflexion/Plantar flexion intact Incision: dressing C/D/I and no  drainage No cellulitis present Compartment soft no calf pain or sign of DVT Dressing - clean, dry, no drainage Motor function intact - moving foot and toes well on exam.   Assessment/Plan: 1 Day Post-Op Procedure(s) (LRB): Right TOTAL KNEE ARTHROPLASTY (Right)  Advance diet Up with therapy D/C IV fluids Past Medical History:  Diagnosis Date  . Acute meniscal tear of left knee   . Arthritis   . Asthma due to environmental allergies   . Complication of anesthesia   . Diverticulosis of sigmoid colon   . History of gastric ulcer   . Hypertension   . PONV (postoperative nausea and vomiting) 1980's  . RA (rheumatoid arthritis) (HCC)   . Swelling of right knee joint    S/P   ARTHROSCOPY -- 09-07-2013  . Wears glasses     DVT Prophylaxis - Xarelto Protocol Weight-Bearing as tolerated to Right leg No vaccines. Anticipate D/C home tomorrow vs. Sunday depending on progress with PT Will discuss with Dr. Elissa Lovett, Dayna Barker. 07/23/2016, 7:51 AM

## 2016-07-23 NOTE — Progress Notes (Signed)
Physical Therapy Treatment Patient Details Name: [name] MRN: [id number] DOB: 20-Jul-1950 Today's Date: 07/23/2016    History of Present Illness Pt s/p R TKR    PT Comments    Pt motivated and progressing well with mobility despite increased pain  Follow Up Recommendations  Outpatient PT     Equipment Recommendations  Rolling walker with 5" wheels    Recommendations for Other Services OT consult     Precautions / Restrictions Precautions Precautions: Knee;Fall Required Braces or Orthoses: Knee Immobilizer - Right Knee Immobilizer - Right: Discontinue once straight leg raise with < 10 degree lag Restrictions Weight Bearing Restrictions: No Other Position/Activity Restrictions: WBAT    Mobility  Bed Mobility Overal bed mobility: Needs Assistance Bed Mobility: Supine to Sit     Supine to sit: Min assist     General bed mobility comments: cues for sequence and use of L LE to self assist  Transfers Overall transfer level: Needs assistance Equipment used: Rolling walker (2 wheeled) Transfers: Sit to/from Stand Sit to Stand: Min assist         General transfer comment: cues for LE management and use of UEs to self assist  Ambulation/Gait Ambulation/Gait assistance: Min assist Ambulation Distance (Feet): 60 Feet Assistive device: Rolling walker (2 wheeled) Gait Pattern/deviations: Step-to pattern;Decreased step length - right;Decreased step length - left;Shuffle Gait velocity: decr Gait velocity interpretation: Below normal speed for age/gender General Gait Details: cues for sequence, posture and position from RW   Stairs            Wheelchair Mobility    Modified Rankin (Stroke Patients Only)       Balance                                    Cognition Arousal/Alertness: Awake/alert Behavior During Therapy: WFL for tasks assessed/performed Overall Cognitive Status: Within Functional Limits for tasks assessed                       Exercises Total Joint Exercises Ankle Circles/Pumps: AROM;Both;15 reps;Supine Quad Sets: AROM;Both;10 reps;Supine Heel Slides: AAROM;Right;15 reps;Supine Straight Leg Raises: AAROM;Right;10 reps;Supine Goniometric ROM: AAROM R knee -10-40    General Comments        Pertinent Vitals/Pain Pain Assessment: 0-10 Pain Score: 5  Pain Location: R knee Pain Descriptors / Indicators: Aching;Sore Pain Intervention(s): Limited activity within patient's tolerance;Monitored during session;Premedicated before session;Patient requesting pain meds-RN notified;Ice applied    Home Living                      Prior Function            PT Goals (current goals can now be found in the care plan section) Acute Rehab PT Goals Patient Stated Goal: Regain IND PT Goal Formulation: With patient Time For Goal Achievement: 07/26/16 Potential to Achieve Goals: Good Progress towards PT goals: Progressing toward goals    Frequency    7X/week      PT Plan Current plan remains appropriate    Co-evaluation             End of Session Equipment Utilized During Treatment: Gait belt;Right knee immobilizer Activity Tolerance: Patient tolerated treatment well Patient left: in chair;with call bell/phone within reach;with family/visitor present     Time: 6295-2841 PT Time Calculation (min) (ACUTE ONLY): 41 min  Charges:  $Gait Training: 23-37 mins $  Therapeutic Exercise: 8-22 mins                    G Codes:      Abril Cappiello 08/20/2016, 9:42 AM

## 2016-07-23 NOTE — Progress Notes (Signed)
Physical Therapy Treatment Patient Details Name: [name] MRN: [id number] DOB: Nov 22, 1950 Today's Date: 07/23/2016    History of Present Illness Pt s/p R TKR    PT Comments    Pt continues motivated but ltd by increased pain this pm  Follow Up Recommendations  Outpatient PT     Equipment Recommendations  Rolling walker with 5" wheels    Recommendations for Other Services OT consult     Precautions / Restrictions Precautions Precautions: Knee;Fall Required Braces or Orthoses: Knee Immobilizer - Right Knee Immobilizer - Right: Discontinue once straight leg raise with < 10 degree lag Restrictions Weight Bearing Restrictions: No Other Position/Activity Restrictions: WBAT    Mobility  Bed Mobility Overal bed mobility: Needs Assistance Bed Mobility: Supine to Sit;Sit to Supine     Supine to sit: Min assist Sit to supine: Min assist   General bed mobility comments: cues for sequence and use of L LE to self assist  Transfers Overall transfer level: Needs assistance Equipment used: Rolling walker (2 wheeled) Transfers: Sit to/from Stand Sit to Stand: Min assist         General transfer comment: cues for LE management and use of UEs to self assist  Ambulation/Gait Ambulation/Gait assistance: Min assist Ambulation Distance (Feet): 68 Feet Assistive device: Rolling walker (2 wheeled) Gait Pattern/deviations: Step-to pattern;Decreased step length - right;Decreased step length - left;Shuffle Gait velocity: decr Gait velocity interpretation: Below normal speed for age/gender General Gait Details: cues for sequence, posture and position from [employer]            Wheelchair Mobility    Modified Rankin (Stroke Patients Only)       Balance Overall balance assessment: No apparent balance deficits (not formally assessed)                                  Cognition Arousal/Alertness: Awake/alert Behavior During Therapy: WFL for tasks  assessed/performed Overall Cognitive Status: Within Functional Limits for tasks assessed                      Exercises      General Comments        Pertinent Vitals/Pain Pain Assessment: 0-10 Pain Score: 5  Pain Location: r thigh area Pain Descriptors / Indicators: Sore Pain Intervention(s): Limited activity within patient's tolerance;Monitored during session;Premedicated before session;Ice applied;Patient requesting pain meds-RN notified;RN gave pain meds during session    Home Living Family/patient expects to be discharged to:: Private residence Living Arrangements: Spouse/significant other Available Help at Discharge: Family Type of Home: House Home Access: Stairs to enter   Home Layout: One level Home Equipment: Environmental consultant - 4 wheels      Prior Function Level of Independence: Independent          PT Goals (current goals can now be found in the care plan section) Acute Rehab PT Goals Patient Stated Goal: Regain IND PT Goal Formulation: With patient Time For Goal Achievement: 07/26/16 Potential to Achieve Goals: Good Progress towards PT goals: Progressing toward goals    Frequency    7X/week      PT Plan Current plan remains appropriate    Co-evaluation             End of Session Equipment Utilized During Treatment: Gait belt;Right knee immobilizer Activity Tolerance: Patient tolerated treatment well;Patient limited by pain Patient left: in bed;with call bell/phone within reach;with family/visitor  present     Time: 1440-1515 PT Time Calculation (min) (ACUTE ONLY): 35 min  Charges:  $Gait Training: 23-37 mins                    G Codes:      Wynton Hufstetler July 29, 2016, 3:21 PM

## 2016-07-23 NOTE — Discharge Summary (Deleted)
Physician Discharge Summary   Patient ID: [name] MRN: [id number] DOB/AGE: 1950-08-04 66 y.o.  Admit date: 07/22/2016 Discharge date: 07/24/2016  Primary Diagnosis: right knee primary osteoarthritis  Admission Diagnoses:  Past Medical History:  Diagnosis Date  . Acute meniscal tear of left knee   . Arthritis   . Asthma due to environmental allergies   . Complication of anesthesia   . Diverticulosis of sigmoid colon   . History of gastric ulcer   . Hypertension   . PONV (postoperative nausea and vomiting) 1980's  . RA (rheumatoid arthritis) ([place])   . Swelling of right knee joint    S/P   ARTHROSCOPY -- 09-07-2013  . Wears glasses    Discharge Diagnoses:   Principal Problem:   Primary osteoarthritis of right knee Active Problems:   Right knee DJD  Estimated body mass index is 28.71 kg/m as calculated from the following:   Height as of this encounter: 5' (1.524 m).   Weight as of this encounter: 66.7 kg (147 lb).  Procedure:  Procedure(s) (LRB): Right TOTAL KNEE ARTHROPLASTY (Right)   Consults: None  HPI: see H&P Laboratory Data: Admission on 07/22/2016  Component Date Value Ref Range Status  . WBC 07/23/2016 11.2* 4.0 - 10.5 K/uL Final  . RBC 07/23/2016 3.33* 3.87 - 5.11 MIL/uL Final  . Hemoglobin 07/23/2016 11.1* 12.0 - 15.0 g/dL Final  . HCT 07/23/2016 32.0* 36.0 - 46.0 % Final  . MCV 07/23/2016 96.1  78.0 - 100.0 fL Final  . MCH 07/23/2016 33.3  26.0 - 34.0 pg Final  . MCHC 07/23/2016 34.7  30.0 - 36.0 g/dL Final  . RDW 07/23/2016 15.0  11.5 - 15.5 % Final  . Platelets 07/23/2016 241  150 - 400 K/uL Final  . Sodium 07/23/2016 136  135 - 145 mmol/L Final  . Potassium 07/23/2016 3.5  3.5 - 5.1 mmol/L Final  . Chloride 07/23/2016 101  101 - 111 mmol/L Final  . CO2 07/23/2016 29  22 - 32 mmol/L Final  . Glucose, Bld 07/23/2016 140* 65 - 99 mg/dL Final  . BUN 07/23/2016 11  6 - 20 mg/dL Final  . Creatinine, Ser 07/23/2016 0.82  0.44 - 1.00 mg/dL Final  .  Calcium 07/23/2016 8.8* 8.9 - 10.3 mg/dL Final  . GFR calc non Af Amer 07/23/2016 >60  >60 mL/min Final  . GFR calc Af Amer 07/23/2016 >60  >60 mL/min Final   Comment: (NOTE) The eGFR has been calculated using the CKD EPI equation. This calculation has not been validated in all clinical situations. eGFR's persistently <60 mL/min signify possible Chronic Kidney Disease.   Georgiann Hahn gap 07/23/2016 6  5 - 15 Final  Hospital Outpatient Visit on 07/16/2016  Component Date Value Ref Range Status  . MRSA, PCR 07/16/2016 NEGATIVE  NEGATIVE Final  . Staphylococcus aureus 07/16/2016 POSITIVE* NEGATIVE Final   Comment:        The Xpert SA Assay (FDA approved for NASAL specimens in patients over 69 years of age), is one component of a comprehensive surveillance program.  Test performance has been validated by [facility] for patients greater than or equal to 28 year old. It is not intended to diagnose infection nor to guide or monitor treatment.   Marland Kitchen aPTT 07/16/2016 41* 24 - 36 seconds Final   Comment:        IF BASELINE aPTT IS ELEVATED, SUGGEST PATIENT RISK ASSESSMENT BE USED TO DETERMINE APPROPRIATE ANTICOAGULANT THERAPY.   . Sodium 07/16/2016 136  135 - 145 mmol/L Final  . Potassium 07/16/2016 3.6  3.5 - 5.1 mmol/L Final  . Chloride 07/16/2016 100* 101 - 111 mmol/L Final  . CO2 07/16/2016 29  22 - 32 mmol/L Final  . Glucose, Bld 07/16/2016 91  65 - 99 mg/dL Final  . BUN 07/16/2016 18  6 - 20 mg/dL Final  . Creatinine, Ser 07/16/2016 0.93  0.44 - 1.00 mg/dL Final  . Calcium 07/16/2016 9.5  8.9 - 10.3 mg/dL Final  . GFR calc non Af Amer 07/16/2016 >60  >60 mL/min Final  . GFR calc Af Amer 07/16/2016 >60  >60 mL/min Final   Comment: (NOTE) The eGFR has been calculated using the CKD EPI equation. This calculation has not been validated in all clinical situations. eGFR's persistently <60 mL/min signify possible Chronic Kidney Disease.   . Anion gap 07/16/2016 7  5 - 15 Final  . WBC  07/16/2016 6.1  4.0 - 10.5 K/uL Final  . RBC 07/16/2016 3.82* 3.87 - 5.11 MIL/uL Final  . Hemoglobin 07/16/2016 12.6  12.0 - 15.0 g/dL Final  . HCT 07/16/2016 36.6  36.0 - 46.0 % Final  . MCV 07/16/2016 95.8  78.0 - 100.0 fL Final  . MCH 07/16/2016 33.0  26.0 - 34.0 pg Final  . MCHC 07/16/2016 34.4  30.0 - 36.0 g/dL Final  . RDW 07/16/2016 14.8  11.5 - 15.5 % Final  . Platelets 07/16/2016 262  150 - 400 K/uL Final  . Prothrombin Time 07/16/2016 12.5  11.4 - 15.2 seconds Final  . INR 07/16/2016 0.93   Final  . ABO/RH(D) 07/16/2016 A POS   Final  . Antibody Screen 07/16/2016 NEG   Final  . Sample Expiration 07/16/2016 07/25/2016   Final  . Extend sample reason 07/16/2016 NO TRANSFUSIONS OR PREGNANCY IN THE PAST 3 MONTHS   Final  . Color, Urine 07/16/2016 YELLOW  YELLOW Final  . APPearance 07/16/2016 CLEAR  CLEAR Final  . Specific Gravity, Urine 07/16/2016 1.010  1.005 - 1.030 Final  . pH 07/16/2016 7.0  5.0 - 8.0 Final  . Glucose, UA 07/16/2016 NEGATIVE  NEGATIVE mg/dL Final  . Hgb urine dipstick 07/16/2016 SMALL* NEGATIVE Final  . Bilirubin Urine 07/16/2016 NEGATIVE  NEGATIVE Final  . Ketones, ur 07/16/2016 NEGATIVE  NEGATIVE mg/dL Final  . Protein, ur 07/16/2016 NEGATIVE  NEGATIVE mg/dL Final  . Nitrite 07/16/2016 NEGATIVE  NEGATIVE Final  . Leukocytes, UA 07/16/2016 NEGATIVE  NEGATIVE Final  . RBC / HPF 07/16/2016 0-5  0 - 5 RBC/hpf Final  . WBC, UA 07/16/2016 0-5  0 - 5 WBC/hpf Final  . Bacteria, UA 07/16/2016 NONE SEEN  NONE SEEN Final  . Squamous Epithelial / LPF 07/16/2016 0-5* NONE SEEN Final  . Mucous 07/16/2016 PRESENT   Final     X-Rays:Dg Knee Right Port  Result Date: 07/22/2016 CLINICAL DATA:  Status post right total knee replacement today. EXAM: PORTABLE RIGHT KNEE - 1-2 VIEW COMPARISON:  MRI right knee 07/14/2013. FINDINGS: Right total knee arthroplasty is in place. No fracture or dislocation. Hardware appears normal. Gas in the soft tissues from surgery noted.  IMPRESSION: Right total knee replacement.  No acute abnormality. Electronically Signed   By: Inge Rise M.D.   On: 07/22/2016 10:04    EKG: Orders placed or performed during the hospital encounter of 07/16/16  . EKG 12 lead  . EKG 12 lead     Hospital Course: DAYLE SHERPA is a 66 y.o. who was admitted to [facility]. They were brought to the operating room on 07/22/2016 and underwent Procedure(s): Right TOTAL KNEE ARTHROPLASTY.  Patient tolerated the procedure well and was later transferred to the recovery room and then to the orthopaedic floor for postoperative care.  They were given PO and IV analgesics for pain control following their surgery.  They were given 24 hours of postoperative antibiotics of  Anti-infectives    Start     Dose/Rate Route Frequency Ordered Stop   07/22/16 1400  ceFAZolin (ANCEF) IVPB 2g/100 mL premix     2 g 200 mL/hr over 30 Minutes Intravenous Every 6 hours 07/22/16 1118 07/22/16 2130   07/22/16 0847  polymyxin B 500,000 Units, bacitracin 50,000 Units in sodium chloride irrigation 0.9 % 500 mL irrigation  Status:  Discontinued       As needed 07/22/16 0847 07/22/16 0934   07/22/16 0600  ceFAZolin (ANCEF) IVPB 2g/100 mL premix     2 g 200 mL/hr over 30 Minutes Intravenous On call to O.R. 07/22/16 [id number] 07/22/16 0726   07/22/16 0504  clindamycin (CLEOCIN) IVPB 900 mg     900 mg 100 mL/hr over 30 Minutes Intravenous On call to O.R. 07/22/16 [id number] 07/22/16 0728     and started on DVT prophylaxis in the form of Xarelto, TED hose and SCDs.   PT and OT were ordered for total joint protocol.  Discharge planning consulted to help with postop disposition and equipment needs.  Patient had a good night on the evening of surgery.  They started to get up OOB with therapy on day one.  Continued to work with therapy into day two. By day two, the patient had progressed with therapy and meeting their goals.  Incision was healing well.  Patient was seen in rounds and  was ready to go home.   Diet: Regular diet Activity:WBAT Follow-up:in 10-14 days Disposition - Home with outpatient PT scheduled for next week (Tuesday 1/30 in [place]) Discharged Condition: good    Allergies as of 07/23/2016      Reactions   Asa [aspirin] Other (See Comments)   Avoids asa 333m, causes right side facial numbness and gi upset/   Pt can take asa 843mwith no issues   Iohexol Hives   Patient needs 13 hour prep before ct scans with contrast   Methadone Other (See Comments)   unknown   Oxycodone Other (See Comments)   Caused sores/lesions in nose   Sulfa Antibiotics Hives   Pregabalin Other (See Comments)   Blisters in throat      Medication List    STOP taking these medications   ketorolac 10 MG tablet Commonly known as:  TORADOL   OVER THE COUNTER MEDICATION     TAKE these medications   acetaminophen 500 MG tablet Commonly known as:  TYLENOL Take 500 mg by mouth every 6 (six) hours as needed for mild pain.   amLODipine 5 MG tablet Commonly known as:  NORVASC Take 5 mg by mouth daily.   cycloSPORINE 0.05 % ophthalmic emulsion Commonly known as:  RESTASIS Place 1 drop into both eyes 2 (two) times daily.   docusate sodium 100 MG capsule Commonly known as:  COLACE Take 1 capsule (100 mg total) by mouth 2 (two) times daily as needed for mild constipation.   folic acid 80290CG tablet Commonly known as:  FOLVITE Take 1,600 mcg by mouth daily. Takes 2 tablets   HYDROcodone-acetaminophen 7.5-325 MG tablet Commonly known as:  NORCO Take 1-2 tablets  by mouth every 4 (four) hours as needed for severe pain.   hydroxychloroquine 200 MG tablet Commonly known as:  PLAQUENIL Take 200 mg by mouth daily.   indapamide 1.25 MG tablet Commonly known as:  LOZOL Take 1.25 mg by mouth daily.   Magnesium 500 MG Caps Take 1,000 mg by mouth at bedtime.   methotrexate 2.5 MG tablet Take 25 mg by mouth once a week. Takes 10 tablets on Sunday's   montelukast 10  MG tablet Commonly known as:  SINGULAIR Take 10 mg by mouth at bedtime.   potassium chloride 10 MEQ tablet Commonly known as:  K-DUR Take 10 mEq by mouth 3 (three) times daily.   PROAIR HFA 108 (90 Base) MCG/ACT inhaler Generic drug:  albuterol Inhale 2 puffs into the lungs every 6 (six) hours as needed for wheezing or shortness of breath.   ranitidine 150 MG capsule Commonly known as:  ZANTAC Take 150 mg by mouth 2 (two) times daily as needed for heartburn.   rivaroxaban 10 MG Tabs tablet Commonly known as:  XARELTO Take 1 tablet (10 mg total) by mouth daily.   traMADol 50 MG tablet Commonly known as:  ULTRAM Take 50 mg by mouth every 6 (six) hours as needed. What changed:  Another medication with the same name was removed. Continue taking this medication, and follow the directions you see here.   Vitamin D 2000 units tablet Take 2,000 Units by mouth daily.   zolpidem 10 MG tablet Commonly known as:  AMBIEN Take 5 mg by mouth at bedtime.      Follow-up Information    BEANE,JEFFREY C, MD Follow up in 2 week(s).   Specialty:  Orthopedic Surgery Contact information: [address] [place] [address] [phone]        Johnn Hai, MD In 2 weeks.   Specialty:  Orthopedic Surgery Contact information: [address] [place] [address] [phone]           Signed: Lacie Draft, PA-C for Dr. Tonita Cong Orthopaedic Surgery 07/23/2016, 2:01 PM

## 2016-07-23 NOTE — Op Note (Signed)
NAME:  [name], CONTINO NO.:  [id number]  MEDICAL RECORD NO.:  [id number]  LOCATION:                                 FACILITY:  PHYSICIAN:  Jene Every, M.D.    DATE OF BIRTH:  December 27, 1950  DATE OF PROCEDURE:  07/22/2016 DATE OF DISCHARGE:                              OPERATIVE REPORT   PREOPERATIVE DIAGNOSIS:  Degenerative joint disease, valgus deformity, right knee.  POSTOPERATIVE DIAGNOSIS:  Degenerative joint disease, valgus deformity, right knee.  PROCEDURE PERFORMED:  Right total knee arthroplasty.  COMPONENTS:  DePuy Attune 4 femur, 4 tibia, 5 insert, 32 patella.  HISTORY:  66, bone-on-bone arthrosis, valgus deformity, right knee. Indicated for placement of degenerated joint failing conservative treatment.  Risks and benefits discussed including bleeding, infection, damage to vascular structures, suboptimal range of motion, DVT, PE, anesthetic complications, etc.  TECHNIQUE:  With the patient in supine position after induction of adequate general anesthesia, the right lower extremity was prepped, draped in usual sterile fashion.  Thigh tourniquet was inflated to 275 mmHg.  Midline incision was made over the knee.  Full-thickness flaps developed.  Median parapatellar arthrotomy performed.  Patella everted and knee flexed.  Tricompartmental bone-on-bone arthrosis noted. Remnants of medial and lateral menisci and ACL were removed, as were osteophytes.  Step drill utilized to enter the femoral canal, was irrigated, 5-degree right was selected with 10 off the distal femur due to a slight flexion to a flexion contracture.  I then performed a distal femoral cut.  We then subluxed the tibia.  External alignment guide, 2 off the defect, was posterolaterally bisecting tibiotalar joint, 3- degree slope.  We performed our tibial cut protecting the soft tissues at all times.  We tried our extension gap with a 5 mm of full extension and stable.  Then flexed the  knee, sized the femur off the anterior cortex.  The tibia 4 was pinned in 3 degrees of external rotation.  We pinned this and placed our 4 block performed anterior, posterior, and chamfer cuts protecting the soft tissues at all times.  We then performed our box cut, centering over the canal.  We then subluxed the tibia.  Sized to a 4, pinned it maximizing the coverage just medial aspect of the tibial tubercle, harvested the bone centrally and placed in the femur.  We then performed our central drill and a punch guide. We placed a femur trial tibia and trial femur.  Drilled the lug holes. Five insert full extension, full flexion, good stability, varus and valgus stressing 0 to 30 degrees.  We then measured the patella to a 21, planed it to a 15, lateralized our drill holes and drilled our PEG holes after sizing it to a 32 and having our sizer parallel to the joint line and placed a trial patella and we had excellent patellofemoral tracking. We removed all trials, checked posteriorly.  Remnants of menisci were removed.  Cauterized the geniculates.  Popliteus was intact.  Pulsatile lavage was used.  Flexed the knee, subluxed the tibia.  All surfaces were thoroughly dried.  Mixed cement on the back table in appropriate fashion, injected into the tibia.  Digitally pressurizing  it, impacted the 4 tibial tray.  Redundant cement removed.  Cemented the 4 femur. Excellent fit.  Redundant cement removed.  5 trial insert, was placed, reduced into full extension with an axial load throughout the curing of the cement.  I cemented the patella and clamped it as well.  After full curing of the cement, lidocaine and epinephrine were placed in the joint and released the tourniquet at 56 minutes.  Cauterized any bleeding. There was minimal.  Flexed the knee, fully.  Meticulously removed all redundant cement.  Copiously irrigated the wound.  Pulsatile lavage and antibiotic irrigation.  Placed a 5 permanent  insert, reduced it, __________ the patella.  Excellent patellofemoral tracking.  Negative anterior drawer.  Full extension, full flexion, good stability, varus and valgus stressing 0 to 30 degrees.  Next, we closed the patellar arthrotomy with 1 Vicryl and then a running STRATAFIX flexion to gravity at 90 degrees.  Subcu with 2-0 and skin with Monocryl.  Sterile dressing applied.  Knee immobilizer.  Extubated and transported to the recovery room in satisfactory condition.  The patient tolerated the procedure well.  No complications.  Assistant, Lanna Poche, [place].  Minimal blood loss.     Jene Every, M.D.   ______________________________ Jene Every, M.D.    Cordelia Pen  D:  07/22/2016  T:  07/23/2016  Job:  [id number]

## 2016-07-23 NOTE — Evaluation (Signed)
Occupational Therapy Evaluation Patient Details Name: [name] MRN: [id number] DOB: 12-10-1950 Today's Date: 07/23/2016    History of Present Illness Pt s/p R TKR   Clinical Impression   Pt is s/p TKA resulting in the deficits listed below (see OT Problem List).  Pt will benefit from skilled OT to increase their safety and independence with ADL and functional mobility for ADL to facilitate discharge to venue listed below.        Follow Up Recommendations  Supervision/Assistance - 24 hour;No OT follow up    Equipment Recommendations  None recommended by OT       Precautions / Restrictions Precautions Precautions: Knee;Fall Required Braces or Orthoses: Knee Immobilizer - Right Knee Immobilizer - Right: Discontinue once straight leg raise with < 10 degree lag Restrictions Weight Bearing Restrictions: No Other Position/Activity Restrictions: WBAT      Mobility Bed Mobility Overal bed mobility: Needs Assistance Bed Mobility: Supine to Sit     Supine to sit: Min assist     General bed mobility comments: cues for sequence and use of L LE to self assist  Transfers Overall transfer level: Needs assistance Equipment used: Rolling walker (2 wheeled) Transfers: Sit to/from Stand Sit to Stand: Min assist         General transfer comment: cues for LE management and use of UEs to self assist    Balance Overall balance assessment: No apparent balance deficits (not formally assessed)                                          ADL Overall ADL's : Needs assistance/impaired Eating/Feeding: Set up;Sitting   Grooming: Set up;Sitting   Upper Body Bathing: Set up;Sitting   Lower Body Bathing: Moderate assistance;Sit to/from stand;Cueing for safety;Cueing for sequencing;Cueing for compensatory techniques   Upper Body Dressing : Set up;Sitting   Lower Body Dressing: Moderate assistance;Cueing for safety;Cueing for sequencing;Cueing for compensatory  techniques   Toilet Transfer: Minimal assistance;RW;BSC;Cueing for sequencing;Cueing for safety   Toileting- Clothing Manipulation and Hygiene: Minimal assistance;Sit to/from stand;Cueing for safety;Cueing for sequencing                         Pertinent Vitals/Pain Pain Assessment: 0-10 Pain Score: 4  Pain Location: r thigh area Pain Descriptors / Indicators: Sore Pain Intervention(s): Monitored during session;Repositioned;Ice applied     Hand Dominance     Extremity/Trunk Assessment Upper Extremity Assessment Upper Extremity Assessment: Overall WFL for tasks assessed           Communication Communication Communication: No difficulties   Cognition Arousal/Alertness: Awake/alert Behavior During Therapy: WFL for tasks assessed/performed Overall Cognitive Status: Within Functional Limits for tasks assessed                     General Comments       Exercises Exercises: Total Joint     Shoulder Instructions      Home Living Family/patient expects to be discharged to:: Private residence Living Arrangements: Spouse/significant other Available Help at Discharge: Family Type of Home: House Home Access: Stairs to enter Secretary/administrator of Steps: 1   Home Layout: One level               Home Equipment: Walker - 4 wheels          Prior Functioning/Environment Level of Independence:  Independent                 OT Problem List: Decreased strength;Decreased knowledge of use of DME or AE;Pain   OT Treatment/Interventions: Self-care/ADL training;DME and/or AE instruction;Patient/family education    OT Goals(Current goals can be found in the care plan section) Acute Rehab OT Goals Patient Stated Goal: Regain IND OT Goal Formulation: With patient Time For Goal Achievement: 08/06/16 Potential to Achieve Goals: Good  OT Frequency: Min 2X/week   Barriers to D/C:               End of Session Equipment Utilized During  Treatment: Rolling walker Nurse Communication: Mobility status  Activity Tolerance: Patient tolerated treatment well Patient left: in chair;with call bell/phone within reach;with family/visitor present   Time: 1010-1026 OT Time Calculation (min): 16 min Charges:  OT Evaluation $OT Eval Moderate Complexity: 1 Procedure G-Codes:    Einar Crow D Aug 10, 2016, 11:58 AM

## 2016-07-23 NOTE — Discharge Instructions (Signed)
Elevate leg above heart 6x a day for 20minutes each °Use knee immobilizer while walking until can SLR x 10 °Use knee immobilizer in bed to keep knee in extension °Aquacel dressing may remain in place until follow up. May shower with aquacel dressing in place. If the dressing becomes saturated or peels off, you may remove aquacel dressing. Do not remove steri-strips if they are present. Place new dressing with gauze and tape or ACE bandage which should be kept clean and dry and changed daily. ° °INSTRUCTIONS AFTER JOINT REPLACEMENT  ° °o Remove items at home which could result in a fall. This includes throw rugs or furniture in walking pathways °o ICE to the affected joint every three hours while awake for 30 minutes at a time, for at least the first 3-5 days, and then as needed for pain and swelling.  Continue to use ice for pain and swelling. You may notice swelling that will progress down to the foot and ankle.  This is normal after surgery.  Elevate your leg when you are not up walking on it.   °o Continue to use the breathing machine you got in the hospital (incentive spirometer) which will help keep your temperature down.  It is common for your temperature to cycle up and down following surgery, especially at night when you are not up moving around and exerting yourself.  The breathing machine keeps your lungs expanded and your temperature down. ° ° °DIET:  As you were doing prior to hospitalization, we recommend a well-balanced diet. ° °DRESSING / WOUND CARE / SHOWERING ° °Keep the surgical dressing until follow up.  The dressing is water proof, so you can shower without any extra covering.  IF THE DRESSING FALLS OFF or the wound gets wet inside, change the dressing with sterile gauze.  Please use good hand washing techniques before changing the dressing.  Do not use any lotions or creams on the incision until instructed by your surgeon.   ° °ACTIVITY ° °o Increase activity slowly as tolerated, but follow the  weight bearing instructions below.   °o No driving for 6 weeks or until further direction given by your physician.  You cannot drive while taking narcotics.  °o No lifting or carrying greater than 10 lbs. until further directed by your surgeon. °o Avoid periods of inactivity such as sitting longer than an hour when not asleep. This helps prevent blood clots.  °o You may return to work once you are authorized by your doctor.  ° ° ° °WEIGHT BEARING  ° °Weight bearing as tolerated with assist device (walker, cane, etc) as directed, use it as long as suggested by your surgeon or therapist, typically at least 4-6 weeks. ° ° °EXERCISES ° °Results after joint replacement surgery are often greatly improved when you follow the exercise, range of motion and muscle strengthening exercises prescribed by your doctor. Safety measures are also important to protect the joint from further injury. Any time any of these exercises cause you to have increased pain or swelling, decrease what you are doing until you are comfortable again and then slowly increase them. If you have problems or questions, call your caregiver or physical therapist for advice.  ° °Rehabilitation is important following a joint replacement. After just a few days of immobilization, the muscles of the leg can become weakened and shrink (atrophy).  These exercises are designed to build up the tone and strength of the thigh and leg muscles and to improve motion. Often   times heat used for twenty to thirty minutes before working out will loosen up your tissues and help with improving the range of motion but do not use heat for the first two weeks following surgery (sometimes heat can increase post-operative swelling).  ° °These exercises can be done on a training (exercise) mat, on the floor, on a table or on a bed. Use whatever works the best and is most comfortable for you.    Use music or television while you are exercising so that the exercises are a pleasant  break in your day. This will make your life better with the exercises acting as a break in your routine that you can look forward to.   Perform all exercises about fifteen times, three times per day or as directed.  You should exercise both the operative leg and the other leg as well. ° °Exercises include: °  °• Quad Sets - Tighten up the muscle on the front of the thigh (Quad) and hold for 5-10 seconds.   °• Straight Leg Raises - With your knee straight (if you were given a brace, keep it on), lift the leg to 60 degrees, hold for 3 seconds, and slowly lower the leg.  Perform this exercise against resistance later as your leg gets stronger.  °• Leg Slides: Lying on your back, slowly slide your foot toward your buttocks, bending your knee up off the floor (only go as far as is comfortable). Then slowly slide your foot back down until your leg is flat on the floor again.  °• Angel Wings: Lying on your back spread your legs to the side as far apart as you can without causing discomfort.  °• Hamstring Strength:  Lying on your back, push your heel against the floor with your leg straight by tightening up the muscles of your buttocks.  Repeat, but this time bend your knee to a comfortable angle, and push your heel against the floor.  You may put a pillow under the heel to make it more comfortable if necessary.  ° °A rehabilitation program following joint replacement surgery can speed recovery and prevent re-injury in the future due to weakened muscles. Contact your doctor or a physical therapist for more information on knee rehabilitation.  ° ° °CONSTIPATION ° °Constipation is defined medically as fewer than three stools per week and severe constipation as less than one stool per week.  Even if you have a regular bowel pattern at home, your normal regimen is likely to be disrupted due to multiple reasons following surgery.  Combination of anesthesia, postoperative narcotics, change in appetite and fluid intake all can  affect your bowels.  ° °YOU MUST use at least one of the following options; they are listed in order of increasing strength to get the job done.  They are all available over the counter, and you may need to use some, POSSIBLY even all of these options:   ° °Drink plenty of fluids (prune juice may be helpful) and high fiber foods °Colace 100 mg by mouth twice a day  °Senokot for constipation as directed and as needed Dulcolax (bisacodyl), take with full glass of water  °Miralax (polyethylene glycol) once or twice a day as needed. ° °If you have tried all these things and are unable to have a bowel movement in the first 3-4 days after surgery call either your surgeon or your primary doctor.   ° °If you experience loose stools or diarrhea, hold the medications until you stool   forms back up.  If your symptoms do not get better within 1 week or if they get worse, check with your doctor.  If you experience "the worst abdominal pain ever" or develop nausea or vomiting, please contact the office immediately for further recommendations for treatment.   ITCHING:  If you experience itching with your medications, try taking only a single pain pill, or even half a pain pill at a time.  You can also use Benadryl over the counter for itching or also to help with sleep.   TED HOSE STOCKINGS:  Use stockings on both legs until for at least 2 weeks or as directed by physician office. They may be removed at night for sleeping.  MEDICATIONS:  See your medication summary on the After Visit Summary that nursing will review with you.  You may have some home medications which will be placed on hold until you complete the course of blood thinner medication.  It is important for you to complete the blood thinner medication as prescribed.  PRECAUTIONS:  If you experience chest pain or shortness of breath - call 911 immediately for transfer to the hospital emergency department.   If you develop a fever greater that 101 F, purulent  drainage from wound, increased redness or drainage from wound, foul odor from the wound/dressing, or calf pain - CONTACT YOUR SURGEON.                                                   FOLLOW-UP APPOINTMENTS:  If you do not already have a post-op appointment, please call the office for an appointment to be seen by your surgeon.  Guidelines for how soon to be seen are listed in your After Visit Summary, but are typically between 1-4 weeks after surgery.  OTHER INSTRUCTIONS:   Knee Replacement:  Do not place pillow under knee, focus on keeping the knee straight while resting. CPM instructions: 0-90 degrees, 2 hours in the morning, 2 hours in the afternoon, and 2 hours in the evening. Place foam block, curve side up under heel at all times except when in CPM or when walking.  DO NOT modify, tear, cut, or change the foam block in any way.  MAKE SURE YOU:   Understand these instructions.   Get help right away if you are not doing well or get worse.    Thank you for letting us be a part of your medical care team.  It is a privilege we respect greatly.  We hope these instructions will help you stay on track for a fast and full recovery!     Information on my medicine - XARELTO (Rivaroxaban)  This medication education was reviewed with me or my healthcare representative as part of my discharge preparation.  The pharmacist that spoke with me during my hospital stay was:  [facility] Ivery Michalski, Student-PharmD  Why was Xarelto prescribed for you? Xarelto was prescribed for you to reduce the risk of blood clots forming after orthopedic surgery. The medical term for these abnormal blood clots is venous thromboembolism (VTE).  What do you need to know about xarelto ? Take your Xarelto ONCE DAILY at the same time every day. You may take it either with or without food.  If you have difficulty swallowing the tablet whole, you may crush it and mix in applesauce just prior to  taking your dose.  Take  Xarelto exactly as prescribed by your doctor and DO NOT stop taking Xarelto without talking to the doctor who prescribed the medication.  Stopping without other VTE prevention medication to take the place of Xarelto may increase your risk of developing a clot.  After discharge, you should have regular check-up appointments with your healthcare provider that is prescribing your Xarelto.    What do you do if you miss a dose? If you miss a dose, take it as soon as you remember on the same day then continue your regularly scheduled once daily regimen the next day. Do not take two doses of Xarelto on the same day.   Important Safety Information A possible side effect of Xarelto is bleeding. You should call your healthcare provider right away if you experience any of the following: ? Bleeding from an injury or your nose that does not stop. ? Unusual colored urine (red or dark brown) or unusual colored stools (red or black). ? Unusual bruising for unknown reasons. ? A serious fall or if you hit your head (even if there is no bleeding).  Some medicines may interact with Xarelto and might increase your risk of bleeding while on Xarelto. To help avoid this, consult your healthcare provider or pharmacist prior to using any new prescription or non-prescription medications, including herbals, vitamins, non-steroidal anti-inflammatory drugs (NSAIDs) and supplements.  This website has more information on Xarelto: [email or web address].br.

## 2016-07-24 LAB — CBC
HEMATOCRIT: 30 % — AB (ref 36.0–46.0)
Hemoglobin: 10.6 g/dL — ABNORMAL LOW (ref 12.0–15.0)
MCH: 34.3 pg — AB (ref 26.0–34.0)
MCHC: 35.3 g/dL (ref 30.0–36.0)
MCV: 97.1 fL (ref 78.0–100.0)
PLATELETS: 232 10*3/uL (ref 150–400)
RBC: 3.09 MIL/uL — AB (ref 3.87–5.11)
RDW: 15.4 % (ref 11.5–15.5)
WBC: 8.6 10*3/uL (ref 4.0–10.5)

## 2016-07-24 MED ORDER — HYDROMORPHONE HCL 2 MG PO TABS
2.0000 mg | ORAL_TABLET | ORAL | Status: DC | PRN
  Administered 2016-07-24 – 2016-07-25 (×3): 2 mg via ORAL
  Filled 2016-07-24 (×3): qty 1

## 2016-07-24 MED ORDER — HYDROMORPHONE HCL 2 MG PO TABS: 2.0000 mg | ORAL_TABLET | ORAL | 0 refills | Status: DC | PRN

## 2016-07-24 NOTE — Progress Notes (Signed)
Occupational Therapy Treatment Patient Details Name: [name] MRN: [id number] DOB: 09-06-1950 Today's Date: 07/24/2016    History of present illness Pt s/p R TKR   OT comments  Pt making progress with OT but pain limiting at times  Follow Up Recommendations  Supervision/Assistance - 24 hour;No OT follow up    Equipment Recommendations  None recommended by OT    Recommendations for Other Services      Precautions / Restrictions Precautions Precautions: Knee;Fall Restrictions Weight Bearing Restrictions: No       Mobility Bed Mobility               General bed mobility comments: pt in chair  Transfers Overall transfer level: Needs assistance Equipment used: Rolling walker (2 wheeled) Transfers: Sit to/from [employer] Sit to Stand: Supervision Stand pivot transfers: Supervision       General transfer comment: VC for safety    Balance                                   ADL Overall ADL's : Needs assistance/impaired     Grooming: Standing;Min guard;Wash/dry face               Lower Body Dressing: Minimal assistance;Cueing for safety;Cueing for sequencing;With adaptive equipment;Cueing for compensatory techniques Lower Body Dressing Details (indicate cue type and reason): husband present for education Toilet Transfer: Supervision/safety;Regular Toilet;RW;Ambulation   Toileting- Clothing Manipulation and Hygiene: Supervision/safety;Sit to/from stand                   Perception     Praxis      Cognition   Behavior During Therapy: [facility] for tasks assessed/performed Overall Cognitive Status: Within Functional Limits for tasks assessed                               General Comments      Pertinent Vitals/ Pain       Pain Score: 4  Pain Location: r thigh area Pain Descriptors / Indicators: Sore Pain Intervention(s): Monitored during session         Frequency  Min 2X/week         Progress Toward Goals  OT Goals(current goals can now be found in the care plan section)  Progress towards OT goals: Progressing toward goals     Plan Discharge plan remains appropriate       End of Session Equipment Utilized During Treatment: Rolling walker   Activity Tolerance Patient tolerated treatment well   Patient Left in chair;with call bell/phone within reach;with family/visitor present   Nurse Communication Mobility status        Time: 1046-1100 OT Time Calculation (min): 14 min  Charges: OT General Charges $OT Visit: 1 Procedure OT Treatments $Self Care/Home Management : 8-22 mins  Eurydice Calixto D 07/24/2016, 11:12 AM

## 2016-07-24 NOTE — Progress Notes (Signed)
Patient awake in bed.  States that pain is 10/10 in right knee surgical site.  Dilaudid 2 mg PO given.

## 2016-07-24 NOTE — Progress Notes (Signed)
Patient ID: [name], female   DOB: 1950/10/15, 66 y.o.   MRN: [id number] Subjective: 2 Days Post-Op Procedure(s) (LRB): Right TOTAL KNEE ARTHROPLASTY (Right) Patient reports pain as moderate.    Patient has complaints of R knee pain. Not well controlled with Norco, even 2 pills at a time. She cannot tolerate Oxycodone. Did well with Dilaudid IV and is wondering if that is an option PO. Feels more sore today than yesterday and not sure she is ready to go home yet today. No other c/o.  Objective: Vital signs in last 24 hours: Temp:  [97.9 F (36.6 C)-98.2 F (36.8 C)] 97.9 F (36.6 C) (01/27 0519) Pulse Rate:  [73-78] 73 (01/27 0519) Resp:  [16] 16 (01/27 0519) BP: (126-149)/(58-65) 126/58 (01/27 0519) SpO2:  [96 %-100 %] 96 % (01/27 0519)  Intake/Output from previous day:  Intake/Output Summary (Last 24 hours) at 07/24/16 0726 Last data filed at 07/24/16 0118  Gross per 24 hour  Intake              246 ml  Output             2150 ml  Net            -1904 ml    Intake/Output this shift: No intake/output data recorded.  Labs: Results for orders placed or performed during the hospital encounter of 07/22/16  CBC  Result Value Ref Range   WBC 11.2 (H) 4.0 - 10.5 K/uL   RBC 3.33 (L) 3.87 - 5.11 MIL/uL   Hemoglobin 11.1 (L) 12.0 - 15.0 g/dL   HCT 40.9 (L) 81.1 - 91.4 %   MCV 96.1 78.0 - 100.0 fL   MCH 33.3 26.0 - 34.0 pg   MCHC 34.7 30.0 - 36.0 g/dL   RDW 78.2 95.6 - 21.3 %   Platelets 241 150 - 400 K/uL  Basic metabolic panel  Result Value Ref Range   Sodium 136 135 - 145 mmol/L   Potassium 3.5 3.5 - 5.1 mmol/L   Chloride 101 101 - 111 mmol/L   CO2 29 22 - 32 mmol/L   Glucose, Bld 140 (H) 65 - 99 mg/dL   BUN 11 6 - 20 mg/dL   Creatinine, Ser 0.86 0.44 - 1.00 mg/dL   Calcium 8.8 (L) 8.9 - 10.3 mg/dL   GFR calc non Af Amer >60 >60 mL/min   GFR calc Af Amer >60 >60 mL/min   Anion gap 6 5 - 15  CBC  Result Value Ref Range   WBC 8.6 4.0 - 10.5 K/uL   RBC 3.09 (L)  3.87 - 5.11 MIL/uL   Hemoglobin 10.6 (L) 12.0 - 15.0 g/dL   HCT 57.8 (L) 46.9 - 62.9 %   MCV 97.1 78.0 - 100.0 fL   MCH 34.3 (H) 26.0 - 34.0 pg   MCHC 35.3 30.0 - 36.0 g/dL   RDW 52.8 41.3 - 24.4 %   Platelets 232 150 - 400 K/uL    Exam - Neurologically intact ABD soft Neurovascular intact Sensation intact distally Intact pulses distally Dorsiflexion/Plantar flexion intact Incision: dressing C/D/I and no drainage No cellulitis present Compartment soft no calf pain or sign of DVT Dressing/Incision - clean, dry, no drainage Motor function intact - moving foot and toes well on exam.   Assessment/Plan: 2 Days Post-Op Procedure(s) (LRB): Right TOTAL KNEE ARTHROPLASTY (Right)  Advance diet Up with therapy D/C IV fluids Past Medical History:  Diagnosis Date  . Acute meniscal tear of left knee   .  Arthritis   . Asthma due to environmental allergies   . Complication of anesthesia   . Diverticulosis of sigmoid colon   . History of gastric ulcer   . Hypertension   . PONV (postoperative nausea and vomiting) 1980's  . RA (rheumatoid arthritis) (HCC)   . Swelling of right knee joint    S/P   ARTHROSCOPY -- 09-07-2013  . Wears glasses     DVT Prophylaxis - Xarelto Protocol Weight-Bearing as tolerated to Right leg Will D/C Norco and start Dilaudid PO today for pain Possible D/C later today if pain remains controlled with Dilaudid otherwise D/C tomorrow  Andrez Grime M. 07/24/2016, 7:26 AM

## 2016-07-24 NOTE — Progress Notes (Signed)
Physical Therapy Treatment Patient Details Name: [name] MRN: [id number] DOB: 11-Apr-1951 Today's Date: 2016-07-28    History of Present Illness Pt s/p R TKR    PT Comments    Therex program reviewed with pt and spouse and with spouse participating hands on.  Pt scheduled for OP PT middle of next week with no HHPT.  Follow Up Recommendations  Outpatient PT     Equipment Recommendations  Rolling walker with 5" wheels    Recommendations for Other Services OT consult     Precautions / Restrictions Precautions Precautions: Knee;Fall Required Braces or Orthoses: Knee Immobilizer - Right Knee Immobilizer - Right: Discontinue once straight leg raise with < 10 degree lag Restrictions Weight Bearing Restrictions: No Other Position/Activity Restrictions: WBAT    Mobility  Bed Mobility               General bed mobility comments: OOB deferred this session  Transfers                    Ambulation/Gait                 Stairs            Wheelchair Mobility    Modified Rankin (Stroke Patients Only)       Balance                                    Cognition Arousal/Alertness: Awake/alert Behavior During Therapy: WFL for tasks assessed/performed Overall Cognitive Status: Within Functional Limits for tasks assessed                      Exercises Total Joint Exercises Ankle Circles/Pumps: AROM;Both;15 reps;Supine Quad Sets: AROM;Both;10 reps;Supine Heel Slides: AAROM;Right;Supine;10 reps Hip ABduction/ADduction: AAROM;Right;10 reps;Supine Straight Leg Raises: AAROM;Right;Supine;15 reps    General Comments        Pertinent Vitals/Pain Pain Assessment: 0-10 Pain Score: 6  Pain Location: r knee/thigh Pain Descriptors / Indicators: Aching;Sore Pain Intervention(s): Limited activity within patient's tolerance;Monitored during session;Premedicated before session;Ice applied    Home Living                       Prior Function            PT Goals (current goals can now be found in the care plan section) Acute Rehab PT Goals Patient Stated Goal: Regain IND PT Goal Formulation: With patient Time For Goal Achievement: 07/26/16 Potential to Achieve Goals: Good Progress towards PT goals: Progressing toward goals    Frequency    7X/week      PT Plan Current plan remains appropriate    Co-evaluation             End of Session   Activity Tolerance: Patient limited by pain Patient left: in bed;with call bell/phone within reach;with family/visitor present     Time: 1338-1410 PT Time Calculation (min) (ACUTE ONLY): 32 min  Charges:  $Therapeutic Exercise: 23-37 mins                    G Codes:      [name] 2016/07/28, 3:52 PM

## 2016-07-24 NOTE — Progress Notes (Signed)
Physical Therapy Treatment Patient Details Name: [name] MRN: [id number] DOB: 12-May-1951 Today's Date: 07/24/2016    History of Present Illness Pt s/p R TKR    PT Comments    Pt continues cooperative but progress pain limited  Follow Up Recommendations  Outpatient PT     Equipment Recommendations  Rolling walker with 5" wheels    Recommendations for Other Services OT consult     Precautions / Restrictions Precautions Precautions: Knee;Fall Required Braces or Orthoses: Knee Immobilizer - Right Knee Immobilizer - Right: Discontinue once straight leg raise with < 10 degree lag Restrictions Weight Bearing Restrictions: No Other Position/Activity Restrictions: WBAT    Mobility  Bed Mobility Overal bed mobility: Needs Assistance Bed Mobility: Supine to Sit     Supine to sit: Min assist     General bed mobility comments: Increased time and cues for sequence and use of L LE to self assist  Transfers Overall transfer level: Needs assistance Equipment used: Rolling walker (2 wheeled) Transfers: Sit to/from Stand Sit to Stand: Min guard Stand pivot transfers: Supervision       General transfer comment: min cues for LE management and use of UEs to self assist  Ambulation/Gait Ambulation/Gait assistance: Min assist;Min guard Ambulation Distance (Feet): 38 Feet Assistive device: Rolling walker (2 wheeled) Gait Pattern/deviations: Step-to pattern;Decreased step length - right;Decreased step length - left;Shuffle Gait velocity: decr Gait velocity interpretation: Below normal speed for age/gender General Gait Details: Increased time with cues for sequence, posture and position from RW; Distance ltd by pain   Stairs            Wheelchair Mobility    Modified Rankin (Stroke Patients Only)       Balance Overall balance assessment: No apparent balance deficits (not formally assessed)                                  Cognition  Arousal/Alertness: Awake/alert Behavior During Therapy: WFL for tasks assessed/performed Overall Cognitive Status: Within Functional Limits for tasks assessed                      Exercises Total Joint Exercises Ankle Circles/Pumps: AROM;Both;15 reps;Supine Quad Sets: AROM;Both;10 reps;Supine Heel Slides: AAROM;Right;15 reps;Supine Straight Leg Raises: AAROM;Right;Supine;15 reps Goniometric ROM: AAROM at R knee -10 - 40  with ++ muscle guarding    General Comments        Pertinent Vitals/Pain Pain Assessment: 0-10 Pain Score: 8  Pain Location: r knee/thigh Pain Descriptors / Indicators: Aching;Sore Pain Intervention(s): Limited activity within patient's tolerance;Monitored during session;Premedicated before session;Ice applied (muscle relax requested)    Home Living                      Prior Function            PT Goals (current goals can now be found in the care plan section) Acute Rehab PT Goals Patient Stated Goal: Regain IND PT Goal Formulation: With patient Time For Goal Achievement: 07/26/16 Potential to Achieve Goals: Good Progress towards PT goals: Progressing toward goals    Frequency    7X/week      PT Plan Current plan remains appropriate    Co-evaluation             End of Session Equipment Utilized During Treatment: Gait belt;Right knee immobilizer Activity Tolerance: Patient limited by pain Patient left: in chair;with call  bell/phone within reach;with family/visitor present     Time: 6644-0347 PT Time Calculation (min) (ACUTE ONLY): 42 min  Charges:  $Gait Training: 23-37 mins $Therapeutic Exercise: 8-22 mins                    G Codes:      [name] 08/16/2016, 12:18 PM

## 2016-07-25 LAB — CBC
HEMATOCRIT: 29.7 % — AB (ref 36.0–46.0)
HEMOGLOBIN: 10.6 g/dL — AB (ref 12.0–15.0)
MCH: 34.5 pg — AB (ref 26.0–34.0)
MCHC: 35.7 g/dL (ref 30.0–36.0)
MCV: 96.7 fL (ref 78.0–100.0)
Platelets: 237 10*3/uL (ref 150–400)
RBC: 3.07 MIL/uL — ABNORMAL LOW (ref 3.87–5.11)
RDW: 15.2 % (ref 11.5–15.5)
WBC: 8.7 10*3/uL (ref 4.0–10.5)

## 2016-07-25 LAB — COMPREHENSIVE METABOLIC PANEL
ALK PHOS: 53 U/L (ref 38–126)
ALT: 11 U/L — AB (ref 14–54)
ANION GAP: 6 (ref 5–15)
AST: 22 U/L (ref 15–41)
Albumin: 3.2 g/dL — ABNORMAL LOW (ref 3.5–5.0)
BILIRUBIN TOTAL: 1 mg/dL (ref 0.3–1.2)
BUN: 10 mg/dL (ref 6–20)
CALCIUM: 8.4 mg/dL — AB (ref 8.9–10.3)
CO2: 31 mmol/L (ref 22–32)
CREATININE: 0.64 mg/dL (ref 0.44–1.00)
Chloride: 96 mmol/L — ABNORMAL LOW (ref 101–111)
GFR calc non Af Amer: >60 mL/min (ref >60)
Glucose, Bld: 91 mg/dL (ref 65–99)
Potassium: 3.6 mmol/L (ref 3.5–5.1)
Sodium: 133 mmol/L — ABNORMAL LOW (ref 135–145)
TOTAL PROTEIN: 6.1 g/dL — AB (ref 6.5–8.1)

## 2016-07-25 NOTE — Care Management Important Message (Signed)
Important Message  Patient Details  Name: [name] MRN: [id number] Date of Birth: 07-17-1950   Medicare Important Message Given:  Yes    Elliot Cousin, RN 07/25/2016, 10:57 AM

## 2016-07-25 NOTE — Progress Notes (Signed)
    Subjective: 3 Days Post-Op Procedure(s) (LRB): Right TOTAL KNEE ARTHROPLASTY (Right) Patient reports pain as 6 on 0-10 scale.   Denies CP or SOB.  Voiding without difficulty. Positive flatus. Objective: Vital signs in last 24 hours: Temp:  [98.5 F (36.9 C)-98.9 F (37.2 C)] 98.7 F (37.1 C) (01/28 0512) Pulse Rate:  [76-91] 91 (01/28 0512) Resp:  [16-18] 16 (01/28 0512) BP: (128-147)/(67) 128/67 (01/28 0512) SpO2:  [97 %-99 %] 97 % (01/28 0512)  Intake/Output from previous day: 01/27 0701 - 01/28 0700 In: 1080 [P.O.:1080] Out: 1850 [Urine:1850] Intake/Output this shift: No intake/output data recorded.  Labs:  Recent Labs  07/23/16 0446 07/24/16 0500 07/25/16 0445  HGB 11.1* 10.6* 10.6*    Recent Labs  07/24/16 0500 07/25/16 0445  WBC 8.6 8.7  RBC 3.09* 3.07*  HCT 30.0* 29.7*  PLT 232 237    Recent Labs  07/23/16 0446 07/25/16 0445  NA 136 133*  K 3.5 3.6  CL 101 96*  CO2 29 31  BUN 11 10  CREATININE 0.82 0.64  GLUCOSE 140* 91  CALCIUM 8.8* 8.4*   No results for input(s): LABPT, INR in the last 72 hours.  Physical Exam: Neurologically intact ABD soft Sensation intact distally Dorsiflexion/Plantar flexion intact Incision: no drainage Compartment soft  Assessment/Plan: 3 Days Post-Op Procedure(s) (LRB): Right TOTAL KNEE ARTHROPLASTY (Right) Up with therapy  Plan for DC home today after cleared by PT Medications are on chart for DC Pt wills tart PT in [place] next week  [place], [place] for Dr. Venita Lick [facility] [phone] 07/25/2016, 7:59 AM

## 2016-07-25 NOTE — Progress Notes (Signed)
Physical Therapy Treatment Patient Details Name: [name] MRN: [id number] DOB: Feb 01, 1951 Today's Date: 2016-08-01    History of Present Illness Pt s/p R TKR    PT Comments    Reviewed home therex program with pt - written instructions provided.  Follow Up Recommendations  Outpatient PT     Equipment Recommendations  Rolling walker with 5" wheels    Recommendations for Other Services OT consult     Precautions / Restrictions Precautions Precautions: Knee;Fall Required Braces or Orthoses: Knee Immobilizer - Right Knee Immobilizer - Right: Discontinue once straight leg raise with < 10 degree lag Restrictions Weight Bearing Restrictions: No Other Position/Activity Restrictions: WBAT    Mobility  Bed Mobility                  Transfers                    Ambulation/Gait                 Stairs            Wheelchair Mobility    Modified Rankin (Stroke Patients Only)       Balance                                    Cognition Arousal/Alertness: Awake/alert Behavior During Therapy: WFL for tasks assessed/performed Overall Cognitive Status: Within Functional Limits for tasks assessed                      Exercises Total Joint Exercises Ankle Circles/Pumps: AROM;Both;15 reps;Supine Quad Sets: AROM;Both;10 reps;Supine Heel Slides: AAROM;Right;Supine;15 reps Hip ABduction/ADduction: AAROM;Right;Supine;15 reps Straight Leg Raises: AAROM;Right;Supine;15 reps Goniometric ROM: AAROM at R knee -10-  40    General Comments        Pertinent Vitals/Pain Pain Assessment: 0-10 Pain Score: 5  Pain Location: r knee/thigh Pain Descriptors / Indicators: Aching;Sore Pain Intervention(s): Limited activity within patient's tolerance;Monitored during session;Premedicated before session;Ice applied    Home Living                      Prior Function            PT Goals (current goals can now be found  in the care plan section) Acute Rehab PT Goals Patient Stated Goal: Regain IND PT Goal Formulation: With patient Time For Goal Achievement: 07/26/16 Potential to Achieve Goals: Good Progress towards PT goals: Progressing toward goals    Frequency    7X/week      PT Plan Current plan remains appropriate    Co-evaluation             End of Session   Activity Tolerance: Patient tolerated treatment well Patient left: in bed;with call bell/phone within reach;with family/visitor present     Time: 0825-0850 PT Time Calculation (min) (ACUTE ONLY): 25 min  Charges:  $Therapeutic Exercise: 23-37 mins                    G Codes:      Morrison Masser 2016/08/01, 11:50 AM

## 2016-07-25 NOTE — Discharge Summary (Signed)
Physician Discharge Summary   Patient ID: [name] MRN: [id number] DOB/AGE: [birth year] 67 y.o.  Admit date: 07/22/2016 Discharge date: 07/25/2016  Primary Diagnosis: right knee primary osteoarthritis  Admission Diagnoses:  Past Medical History:  Diagnosis Date  . Acute meniscal tear of left knee   . Arthritis   . Asthma due to environmental allergies   . Complication of anesthesia   . Diverticulosis of sigmoid colon   . History of gastric ulcer   . Hypertension   . PONV (postoperative nausea and vomiting) 1980's  . RA (rheumatoid arthritis) ([place])   . Swelling of right knee joint    S/P   ARTHROSCOPY -- 09-07-2013  . Wears glasses    Discharge Diagnoses:   Principal Problem:   Primary osteoarthritis of right knee Active Problems:   Right knee DJD  Estimated body mass index is 28.71 kg/m as calculated from the following:   Height as of this encounter: 5' (1.524 m).   Weight as of this encounter: 66.7 kg (147 lb).  Procedure:  Procedure(s) (LRB): Right TOTAL KNEE ARTHROPLASTY (Right)   Consults: None  HPI: see H&P Laboratory Data: Admission on 07/22/2016, Discharged on 07/25/2016  Component Date Value Ref Range Status  . WBC 07/23/2016 11.2* 4.0 - 10.5 K/uL Final  . RBC 07/23/2016 3.33* 3.87 - 5.11 MIL/uL Final  . Hemoglobin 07/23/2016 11.1* 12.0 - 15.0 g/dL Final  . HCT 07/23/2016 32.0* 36.0 - 46.0 % Final  . MCV 07/23/2016 96.1  78.0 - 100.0 fL Final  . MCH 07/23/2016 33.3  26.0 - 34.0 pg Final  . MCHC 07/23/2016 34.7  30.0 - 36.0 g/dL Final  . RDW 07/23/2016 15.0  11.5 - 15.5 % Final  . Platelets 07/23/2016 241  150 - 400 K/uL Final  . Sodium 07/23/2016 136  135 - 145 mmol/L Final  . Potassium 07/23/2016 3.5  3.5 - 5.1 mmol/L Final  . Chloride 07/23/2016 101  101 - 111 mmol/L Final  . CO2 07/23/2016 29  22 - 32 mmol/L Final  . Glucose, Bld 07/23/2016 140* 65 - 99 mg/dL Final  . BUN 07/23/2016 11  6 - 20 mg/dL Final  . Creatinine, Ser 07/23/2016 0.82   0.44 - 1.00 mg/dL Final  . Calcium 07/23/2016 8.8* 8.9 - 10.3 mg/dL Final  . GFR calc non Af Amer 07/23/2016 >60  >60 mL/min Final  . GFR calc Af Amer 07/23/2016 >60  >60 mL/min Final   Comment: (NOTE) The eGFR has been calculated using the CKD EPI equation. This calculation has not been validated in all clinical situations. eGFR's persistently <60 mL/min signify possible Chronic Kidney Disease.   . Anion gap 07/23/2016 6  5 - 15 Final  . WBC 07/24/2016 8.6  4.0 - 10.5 K/uL Final  . RBC 07/24/2016 3.09* 3.87 - 5.11 MIL/uL Final  . Hemoglobin 07/24/2016 10.6* 12.0 - 15.0 g/dL Final  . HCT 07/24/2016 30.0* 36.0 - 46.0 % Final  . MCV 07/24/2016 97.1  78.0 - 100.0 fL Final  . MCH 07/24/2016 34.3* 26.0 - 34.0 pg Final  . MCHC 07/24/2016 35.3  30.0 - 36.0 g/dL Final  . RDW 07/24/2016 15.4  11.5 - 15.5 % Final  . Platelets 07/24/2016 232  150 - 400 K/uL Final  . WBC 07/25/2016 8.7  4.0 - 10.5 K/uL Final  . RBC 07/25/2016 3.07* 3.87 - 5.11 MIL/uL Final  . Hemoglobin 07/25/2016 10.6* 12.0 - 15.0 g/dL Final  . HCT 07/25/2016 29.7* 36.0 - 46.0 % Final  .  MCV 07/25/2016 96.7  78.0 - 100.0 fL Final  . MCH 07/25/2016 34.5* 26.0 - 34.0 pg Final  . MCHC 07/25/2016 35.7  30.0 - 36.0 g/dL Final  . RDW 07/25/2016 15.2  11.5 - 15.5 % Final  . Platelets 07/25/2016 237  150 - 400 K/uL Final  . Sodium 07/25/2016 133* 135 - 145 mmol/L Final  . Potassium 07/25/2016 3.6  3.5 - 5.1 mmol/L Final  . Chloride 07/25/2016 96* 101 - 111 mmol/L Final  . CO2 07/25/2016 31  22 - 32 mmol/L Final  . Glucose, Bld 07/25/2016 91  65 - 99 mg/dL Final  . BUN 07/25/2016 10  6 - 20 mg/dL Final  . Creatinine, Ser 07/25/2016 0.64  0.44 - 1.00 mg/dL Final  . Calcium 07/25/2016 8.4* 8.9 - 10.3 mg/dL Final  . Total Protein 07/25/2016 6.1* 6.5 - 8.1 g/dL Final  . Albumin 07/25/2016 3.2* 3.5 - 5.0 g/dL Final  . AST 07/25/2016 22  15 - 41 U/L Final  . ALT 07/25/2016 11* 14 - 54 U/L Final  . Alkaline Phosphatase 07/25/2016 53   38 - 126 U/L Final  . Total Bilirubin 07/25/2016 1.0  0.3 - 1.2 mg/dL Final  . GFR calc non Af Amer 07/25/2016 >60  >60 mL/min Final  . GFR calc Af Amer 07/25/2016 >60  >60 mL/min Final   Comment: (NOTE) The eGFR has been calculated using the CKD EPI equation. This calculation has not been validated in all clinical situations. eGFR's persistently <60 mL/min signify possible Chronic Kidney Disease.   Georgiann Hahn gap 07/25/2016 6  5 - 15 Final  Hospital Outpatient Visit on 07/16/2016  Component Date Value Ref Range Status  . MRSA, PCR 07/16/2016 NEGATIVE  NEGATIVE Final  . Staphylococcus aureus 07/16/2016 POSITIVE* NEGATIVE Final   Comment:        The Xpert SA Assay (FDA approved for NASAL specimens in patients over 15 years of age), is one component of a comprehensive surveillance program.  Test performance has been validated by [facility] for patients greater than or equal to 46 year old. It is not intended to diagnose infection nor to guide or monitor treatment.   Marland Kitchen aPTT 07/16/2016 41* 24 - 36 seconds Final   Comment:        IF BASELINE aPTT IS ELEVATED, SUGGEST PATIENT RISK ASSESSMENT BE USED TO DETERMINE APPROPRIATE ANTICOAGULANT THERAPY.   . Sodium 07/16/2016 136  135 - 145 mmol/L Final  . Potassium 07/16/2016 3.6  3.5 - 5.1 mmol/L Final  . Chloride 07/16/2016 100* 101 - 111 mmol/L Final  . CO2 07/16/2016 29  22 - 32 mmol/L Final  . Glucose, Bld 07/16/2016 91  65 - 99 mg/dL Final  . BUN 07/16/2016 18  6 - 20 mg/dL Final  . Creatinine, Ser 07/16/2016 0.93  0.44 - 1.00 mg/dL Final  . Calcium 07/16/2016 9.5  8.9 - 10.3 mg/dL Final  . GFR calc non Af Amer 07/16/2016 >60  >60 mL/min Final  . GFR calc Af Amer 07/16/2016 >60  >60 mL/min Final   Comment: (NOTE) The eGFR has been calculated using the CKD EPI equation. This calculation has not been validated in all clinical situations. eGFR's persistently <60 mL/min signify possible Chronic Kidney Disease.   . Anion gap  07/16/2016 7  5 - 15 Final  . WBC 07/16/2016 6.1  4.0 - 10.5 K/uL Final  . RBC 07/16/2016 3.82* 3.87 - 5.11 MIL/uL Final  . Hemoglobin 07/16/2016 12.6  12.0 - 15.0  g/dL Final  . HCT 07/16/2016 36.6  36.0 - 46.0 % Final  . MCV 07/16/2016 95.8  78.0 - 100.0 fL Final  . MCH 07/16/2016 33.0  26.0 - 34.0 pg Final  . MCHC 07/16/2016 34.4  30.0 - 36.0 g/dL Final  . RDW 07/16/2016 14.8  11.5 - 15.5 % Final  . Platelets 07/16/2016 262  150 - 400 K/uL Final  . Prothrombin Time 07/16/2016 12.5  11.4 - 15.2 seconds Final  . INR 07/16/2016 0.93   Final  . ABO/RH(D) 07/16/2016 A POS   Final  . Antibody Screen 07/16/2016 NEG   Final  . Sample Expiration 07/16/2016 07/25/2016   Final  . Extend sample reason 07/16/2016 NO TRANSFUSIONS OR PREGNANCY IN THE PAST 3 MONTHS   Final  . Color, Urine 07/16/2016 YELLOW  YELLOW Final  . APPearance 07/16/2016 CLEAR  CLEAR Final  . Specific Gravity, Urine 07/16/2016 1.010  1.005 - 1.030 Final  . pH 07/16/2016 7.0  5.0 - 8.0 Final  . Glucose, UA 07/16/2016 NEGATIVE  NEGATIVE mg/dL Final  . Hgb urine dipstick 07/16/2016 SMALL* NEGATIVE Final  . Bilirubin Urine 07/16/2016 NEGATIVE  NEGATIVE Final  . Ketones, ur 07/16/2016 NEGATIVE  NEGATIVE mg/dL Final  . Protein, ur 07/16/2016 NEGATIVE  NEGATIVE mg/dL Final  . Nitrite 07/16/2016 NEGATIVE  NEGATIVE Final  . Leukocytes, UA 07/16/2016 NEGATIVE  NEGATIVE Final  . RBC / HPF 07/16/2016 0-5  0 - 5 RBC/hpf Final  . WBC, UA 07/16/2016 0-5  0 - 5 WBC/hpf Final  . Bacteria, UA 07/16/2016 NONE SEEN  NONE SEEN Final  . Squamous Epithelial / LPF 07/16/2016 0-5* NONE SEEN Final  . Mucous 07/16/2016 PRESENT   Final     X-Rays:Dg Knee Right Port  Result Date: 07/22/2016 CLINICAL DATA:  Status post right total knee replacement today. EXAM: PORTABLE RIGHT KNEE - 1-2 VIEW COMPARISON:  MRI right knee 07/14/2013. FINDINGS: Right total knee arthroplasty is in place. No fracture or dislocation. Hardware appears normal. Gas in the soft  tissues from surgery noted. IMPRESSION: Right total knee replacement.  No acute abnormality. Electronically Signed   By: Inge Rise M.D.   On: 07/22/2016 10:04    EKG: Orders placed or performed during the hospital encounter of 07/16/16  . EKG 12 lead  . EKG 12 lead     Hospital Course: [name] is a 66 y.o. who was admitted to [facility]. They were brought to the operating room on 07/22/2016 and underwent Procedure(s): Right TOTAL KNEE ARTHROPLASTY.  Patient tolerated the procedure well and was later transferred to the recovery room and then to the orthopaedic floor for postoperative care.  They were given PO and IV analgesics for pain control following their surgery.  They were given 24 hours of postoperative antibiotics of  Anti-infectives    Start     Dose/Rate Route Frequency Ordered Stop   07/22/16 1400  ceFAZolin (ANCEF) IVPB 2g/100 mL premix     2 g 200 mL/hr over 30 Minutes Intravenous Every 6 hours 07/22/16 1118 07/22/16 2130   07/22/16 0847  polymyxin B 500,000 Units, bacitracin 50,000 Units in sodium chloride irrigation 0.9 % 500 mL irrigation  Status:  Discontinued       As needed 07/22/16 0847 07/22/16 0934   07/22/16 0600  ceFAZolin (ANCEF) IVPB 2g/100 mL premix     2 g 200 mL/hr over 30 Minutes Intravenous On call to O.R. 07/22/16 1025 07/22/16 0726   07/22/16 0504  clindamycin (  CLEOCIN) IVPB 900 mg     900 mg 100 mL/hr over 30 Minutes Intravenous On call to O.R. 07/22/16 7867 07/22/16 0728     and started on DVT prophylaxis in the form of Xarelto, TED hose and SCDs.  PT and OT were ordered for total joint protocol.  Discharge planning consulted to help with postop disposition and equipment needs.  Patient had a good night on the evening of surgery.  They started to get up OOB with therapy on day one. Continued to work with therapy into day two. Pain control suboptimal on POD2 improved by POD3.  By day three, the patient had progressed with therapy and  meeting their goals.  Incision was healing well.  Patient was seen in rounds and was ready to go home.   Diet: Regular diet Activity:WBAT Follow-up:in 10-14 days Disposition - Home Discharged Condition: good    Allergies as of 07/25/2016      Reactions   Asa [aspirin] Other (See Comments)   Avoids asa 343m, causes right side facial numbness and gi upset/   Pt can take asa 886mwith no issues   Iohexol Hives   Patient needs 13 hour prep before ct scans with contrast   Methadone Other (See Comments)   unknown   Oxycodone Other (See Comments)   Caused sores/lesions in nose   Sulfa Antibiotics Hives   Pregabalin Other (See Comments)   Blisters in throat      Medication List    STOP taking these medications   ketorolac 10 MG tablet Commonly known as:  TORADOL   OVER THE COUNTER MEDICATION     TAKE these medications   acetaminophen 500 MG tablet Commonly known as:  TYLENOL Take 500 mg by mouth every 6 (six) hours as needed for mild pain.   amLODipine 5 MG tablet Commonly known as:  NORVASC Take 5 mg by mouth daily.   cycloSPORINE 0.05 % ophthalmic emulsion Commonly known as:  RESTASIS Place 1 drop into both eyes 2 (two) times daily.   docusate sodium 100 MG capsule Commonly known as:  COLACE Take 1 capsule (100 mg total) by mouth 2 (two) times daily as needed for mild constipation.   folic acid 80672CG tablet Commonly known as:  FOLVITE Take 1,600 mcg by mouth daily. Takes 2 tablets   HYDROmorphone 2 MG tablet Commonly known as:  DILAUDID Take 1 tablet (2 mg total) by mouth every 4 (four) hours as needed for severe pain.   hydroxychloroquine 200 MG tablet Commonly known as:  PLAQUENIL Take 200 mg by mouth daily.   indapamide 1.25 MG tablet Commonly known as:  LOZOL Take 1.25 mg by mouth daily.   Magnesium 500 MG Caps Take 1,000 mg by mouth at bedtime.   methotrexate 2.5 MG tablet Take 25 mg by mouth once a week. Takes 10 tablets on Sunday's     montelukast 10 MG tablet Commonly known as:  SINGULAIR Take 10 mg by mouth at bedtime.   potassium chloride 10 MEQ tablet Commonly known as:  K-DUR Take 10 mEq by mouth 3 (three) times daily.   PROAIR HFA 108 (90 Base) MCG/ACT inhaler Generic drug:  albuterol Inhale 2 puffs into the lungs every 6 (six) hours as needed for wheezing or shortness of breath.   ranitidine 150 MG capsule Commonly known as:  ZANTAC Take 150 mg by mouth 2 (two) times daily as needed for heartburn.   rivaroxaban 10 MG Tabs tablet Commonly known as:  XAAlveda Reasons  Take 1 tablet (10 mg total) by mouth daily.   traMADol 50 MG tablet Commonly known as:  ULTRAM Take 50 mg by mouth every 6 (six) hours as needed. What changed:  Another medication with the same name was removed. Continue taking this medication, and follow the directions you see here.   Vitamin D 2000 units tablet Take 2,000 Units by mouth daily.   zolpidem 10 MG tablet Commonly known as:  AMBIEN Take 5 mg by mouth at bedtime.      Follow-up Information    BEANE,JEFFREY C, MD Follow up in 2 week(s).   Specialty:  Orthopedic Surgery Contact information: [address] [place] [address] [phone]        Johnn Hai, MD In 2 weeks.   Specialty:  Orthopedic Surgery Contact information: [address] [place] [address] [phone]           Signed: Lacie Draft, PA-C Orthopaedic Surgery 07/25/2016, 8:25 PM

## 2016-07-25 NOTE — Care Management Note (Signed)
Case Management Note  Patient Details  Name: [name] MRN: [id number] Date of Birth: [birth year]  Subjective/Objective:    S/p R TKR                Action/Plan: Discharge Planning: NCM spoke to pt and states she is scheduled to attend outpt PT at [facility]. States she has RW in room. Notified [facility] that pt going to outpt PT.    PCP Benedetto Goad MD   Expected Discharge Date:  07/25/16               Expected Discharge Plan:  Home/Self Care  In-House Referral:  NA  Discharge planning Services  CM Consult  Post Acute Care Choice:  NA Choice offered to:  NA  DME Arranged:  Walker rolling DME Agency:  [facility]  HH Arranged:  NA HH Agency:  NA  Status of Service:  Completed, signed off  If discussed at Long Length of Stay Meetings, dates discussed:    Additional Comments:  Elliot Cousin, RN 07/25/2016, 10:45 AM

## 2016-07-25 NOTE — Progress Notes (Signed)
Physical Therapy Treatment Patient Details Name: [name] MRN: [id number] DOB: May 13, 1951 Today's Date: 07/25/2016    History of Present Illness Pt s/p R TKR    PT Comments    Pt continues moving slowly but with marked improvement in activity tolerance.  Reviewed step up to utilize stool to get into high bed  Follow Up Recommendations  Outpatient PT     Equipment Recommendations  Rolling walker with 5" wheels    Recommendations for Other Services OT consult     Precautions / Restrictions Precautions Precautions: Knee;Fall Required Braces or Orthoses: Knee Immobilizer - Right Knee Immobilizer - Right: Discontinue once straight leg raise with < 10 degree lag Restrictions Weight Bearing Restrictions: No Other Position/Activity Restrictions: WBAT    Mobility  Bed Mobility Overal bed mobility: Needs Assistance Bed Mobility: Supine to Sit     Supine to sit: Min assist     General bed mobility comments: cues for sequence and min assist for R LE  Transfers Overall transfer level: Needs assistance Equipment used: Rolling walker (2 wheeled) Transfers: Sit to/from Stand Sit to Stand: Supervision         General transfer comment: min cues for LE management and use of UEs to self assist  Ambulation/Gait Ambulation/Gait assistance: Min guard;Supervision Ambulation Distance (Feet): 65 Feet Assistive device: Rolling walker (2 wheeled) Gait Pattern/deviations: Step-to pattern;Decreased step length - right;Decreased step length - left;Shuffle Gait velocity: decr Gait velocity interpretation: Below normal speed for age/gender General Gait Details: Increased time with cues for sequence, posture and position from RW;   Stairs Stairs: Yes   Stair Management: No rails;Step to pattern;Backwards;With walker Number of Stairs: 1 General stair comments: cues for sequence and foot placement  Wheelchair Mobility    Modified Rankin (Stroke Patients Only)        Balance Overall balance assessment: No apparent balance deficits (not formally assessed)                                  Cognition Arousal/Alertness: Awake/alert Behavior During Therapy: WFL for tasks assessed/performed Overall Cognitive Status: Within Functional Limits for tasks assessed                      Exercises Total Joint Exercises Ankle Circles/Pumps: AROM;Both;15 reps;Supine Quad Sets: AROM;Both;10 reps;Supine Heel Slides: AAROM;Right;Supine;15 reps Hip ABduction/ADduction: AAROM;Right;Supine;15 reps Straight Leg Raises: AAROM;Right;Supine;15 reps Goniometric ROM: AAROM at R knee -10-  40    General Comments        Pertinent Vitals/Pain Pain Assessment: 0-10 Pain Score: 5  Pain Location: r knee/thigh Pain Descriptors / Indicators: Aching;Sore Pain Intervention(s): Limited activity within patient's tolerance;Monitored during session;Premedicated before session;Ice applied    Home Living                      Prior Function            PT Goals (current goals can now be found in the care plan section) Acute Rehab PT Goals Patient Stated Goal: Regain IND PT Goal Formulation: With patient Time For Goal Achievement: 07/26/16 Potential to Achieve Goals: Good Progress towards PT goals: Progressing toward goals    Frequency    7X/week      PT Plan Current plan remains appropriate    Co-evaluation             End of Session Equipment Utilized During Treatment: Gait belt;Right  knee immobilizer Activity Tolerance: Patient tolerated treatment well Patient left: in chair;with call bell/phone within reach;with family/visitor present     Time: 9030-0923 PT Time Calculation (min) (ACUTE ONLY): 23 min  Charges:  $Gait Training: 23-37 mins $Therapeutic Exercise: 23-37 mins                    G Codes:      Kindel Rochefort 08/15/2016, 11:57 AM

## 2017-05-02 ENCOUNTER — Other Ambulatory Visit: Payer: Self-pay | Admitting: Family Medicine

## 2017-05-02 ENCOUNTER — Ambulatory Visit
Admission: RE | Admit: 2017-05-02 | Discharge: 2017-05-02 | Disposition: A | Discharge status: Home / Self Care | Payer: Medicare Other | Source: Ambulatory Visit | Attending: Family Medicine | Admitting: Family Medicine

## 2017-05-02 DIAGNOSIS — R062 Wheezing: Secondary | ICD-10-CM

## 2017-05-02 DIAGNOSIS — R058 Other specified cough: Secondary | ICD-10-CM

## 2017-05-02 DIAGNOSIS — R05 Cough: Secondary | ICD-10-CM

## 2017-09-26 DIAGNOSIS — B029 Zoster without complications: Secondary | ICD-10-CM

## 2017-09-26 HISTORY — DX: Zoster without complications: B02.9

## 2018-06-07 ENCOUNTER — Ambulatory Visit: Payer: Self-pay | Admitting: Orthopedic Surgery

## 2018-07-11 ENCOUNTER — Ambulatory Visit: Payer: Self-pay | Admitting: Orthopedic Surgery

## 2018-07-11 NOTE — H&P (View-Only) (Signed)
[name] is an 68 y.o. female.   Chief Complaint: L knee pain HPI: The patient is here for her H & P. She is scheduled for a left total knee arthroplasty on 07/26/18 at [facility] by Dr. Tonita Cong.  She reports chronic progressively worsening left knee pain refractory to injection therapy with both cortisone and Visco supplementation, bracing, quad strengthening, activity modifications, medications, relative rest. Symptoms at this point are interfering with activities of daily living and quality-of-life and she desires to proceed with total knee replacement.  Dr. Tonita Cong and the patient mutually agreed to proceed with a left total knee replacement. Risks and benefits of the procedure were discussed including stiffness, suboptimal range of motion, persistent pain, infection requiring removal of prosthesis and reinsertion, need for prophylactic antibiotics in the future, for example, dental procedures, possible need for manipulation, revision in the future and also anesthetic complications including DVT, PE, etc. We discussed the perioperative course, time in the hospital, postoperative recovery and the need for elevation to control swelling. We also discussed the predicted range of motion and the probability that squatting and kneeling would be unobtainable in the future. In addition, postoperative anticoagulation was discussed. We have obtained preoperative medical clearance as necessary. Provided illustrated handout and discussed it in detail. They will enroll in the total joint replacement educational forum at the hospital.  When we did her right knee replacement 2 years ago, we utilized Dilaudid by mouth postoperatively for pain control and she tolerated that well along with tramadol, around of Toradol for inflammation, tizanidine, Colace and MiraLAX. Since she has an aspirin allergy we did utilize Xarelto and she tolerated that well. She has had thrush with clindamycin in the past. When we did her total  knee on the right, she did immediate outpatient physical therapy at [facility] rocking him in [place] and did well with that. She would like to do the same again.  Past Medical History:  Diagnosis Date  . Acute meniscal tear of left knee   . Arthritis   . Asthma due to environmental allergies   . Complication of anesthesia   . Diverticulosis of sigmoid colon   . History of gastric ulcer   . Hypertension   . PONV (postoperative nausea and vomiting) 1980's  . RA (rheumatoid arthritis) ([place])   . Swelling of right knee joint    S/P   ARTHROSCOPY -- 09-07-2013  . Wears glasses     Past Surgical History:  Procedure Laterality Date  . APPENDECTOMY  1976  . KNEE ARTHROSCOPY Left 12/07/2013   Procedure: ARTHROSCOPY LEFT KNEE WITH DEBRIDEMENT;  Surgeon: Johnn Hai, MD;  Location: [place];  Service: Orthopedics;  Laterality: Left;  . KNEE ARTHROSCOPY WITH LATERAL MENISECTOMY Right 09/07/2013   Procedure: RIGHT KNEE ARTHROSCOPY WITH PARTIAL MEDIAL AND LATERAL MENISECTOMY/DEBRIDEMENT/ ;  Surgeon: Johnn Hai, MD;  Location: [place];  Service: Orthopedics;  Laterality: Right;  . SHOULDER OPEN ROTATOR CUFF REPAIR Bilateral right  1993/   left  1998  . TARSAL TUNNEL RELEASE Bilateral right 2010/   left 2009   feet  . TOTAL KNEE ARTHROPLASTY Right 07/22/2016   Procedure: Right TOTAL KNEE ARTHROPLASTY;  Surgeon: Susa Day, MD;  Location: [facility];  Service: Orthopedics;  Laterality: Right;  Requests 2 hours  . VAGINAL HYSTERECTOMY  1983    No family history on file. Social History:  reports that she quit smoking about 18 years ago. Her smoking use included cigarettes. She has  a 15.00 pack-year smoking history. She has never used smokeless tobacco. She reports that she does not drink alcohol or use drugs.  Allergies:  Allergies  Allergen Reactions  . Asa [Aspirin] Other (See Comments)    Avoids asa 356m, causes right side facial numbness and gi upset/   Pt  can take asa 824mwith no issues  . Iohexol Hives    Patient needs 13 hour prep before ct scans with contrast  . Methadone Other (See Comments)    unknown  . Oxycodone Other (See Comments)    Caused sores/lesions in nose   . Sulfa Antibiotics Hives  . Zostavax [Zoster Vaccine Live] Other (See Comments)    Made pt feel sick with flu  . Pregabalin Other (See Comments)    Blisters in throat   Medications: albuterol sulfate 1.25 mg/3 mL solution for nebulization amLODIPine 5 mg tablet gabapentin 100 mg capsule hydroxychloroquine 200 mg tablet indapamide 1.25 mg tablet montelukast 10 mg tablet potassium chloride ER 10 mEq tablet,extended release Shingrix (PF) 50 mcg/0.5 mL intramuscular suspension, kit tiZANidine 4 mg tablet traMADoL 50 mg tablet zolpidem 10 mg tablet   Review of Systems  Constitutional: Negative.   HENT: Negative.   Eyes: Negative.   Respiratory: Negative.   Cardiovascular: Negative.   Gastrointestinal: Negative.   Genitourinary: Negative.   Musculoskeletal: Positive for back pain and joint pain.  Skin: Negative.   Neurological: Negative.   Psychiatric/Behavioral: Negative.     There were no vitals taken for this visit. Physical Exam  Constitutional: She is oriented to person, place, and time. She appears well-developed and well-nourished.  HENT:  Head: Normocephalic.  Eyes: Pupils are equal, round, and reactive to light.  Neck: Normal range of motion.  Cardiovascular: Normal rate.  Respiratory: Effort normal.  GI: Soft.  Musculoskeletal:     Comments: Patient is a [age]ear old female.  Constitutional: General Appearance: healthy-appearing and NAD.  Gait and Station: Appearance: antalgic gait.  Cardiovascular System: Arterial Pulses Left: femoral normal, popliteal normal, dorsalis pedis normal, and posterior tibialis normal. Edema Left: no edema. Varicosities Left: no varicosities.  Lymph Nodes: Inspection/Palpation Left: no inguinal  LAD.  Knees: Inspection Left: no deformity and swelling. Bony Palpation Left: no tenderness of the superior pole patella, the inferior pole patella, the tibial tubercle, the medial joint line, Gerdy's tubercle, or the neck of fibula and tenderness of the lateral joint line and the medial tibial plateau. Soft Tissue Palpation Left: no tenderness of the quadriceps tendon, the prepatellar bursa, the patellar tendon, the medial collateral ligament, or the infrapatellar tendon. Active Range of Motion Left: limited. Stability Left: no laxity or ligamentous instability and anterior drawer sign negative and Lachman test negative. Special Tests Left: McMurray's test negative. Strength Left: no hamstring weakness or quadriceps weakness and flexion 5/5 and extension 5/5.  Skin: Left Lower Extremity: normal.  Neurologic: Ankle Reflex Left: normal (2). Knee Reflex Left: normal (2). Sensation on the Left: L2 normal, L3 normal, L4 normal, L5 normal, and S1 normal.  Psychiatric: Mood and Affect: active and alert and normal mood.  Neurological: She is alert and oriented to person, place, and time.  Skin: Skin is dry.    MRI demonstrates severe osteoarthrosis of the medial compartment. Complex tear of the medial meniscus horizontal tearing.  Also some patellofemoral arthrosis.  Degenerative changes of the lateral compartment are noted. Small horizontal tear of the anterior horn of lateral meniscus.  Assessment/Plan Impression: Left knee end-stage osteoarthritis  Plan: Pt with end-stage Left  knee DJD, bone-on-bone, refractory to conservative tx, scheduled for Left total knee replacement by Dr. Tonita Cong on 07/26/2018. We again discussed the procedure itself as well as risks, complications and alternatives, including but not limited to DVT, PE, infx, bleeding, failure of procedure, need for secondary procedure including manipulation, nerve injury, ongoing pain/symptoms, anesthesia risk, even stroke or death. Also  discussed typical post-op protocols, activity restrictions, need for PT, flexion/extension exercises, time out of work. Discussed need for DVT ppx post-op per protocol. Discussed dental ppx and infx prevention. Also discussed limitations post-operatively such as kneeling and squatting. All questions were answered. Patient desires to proceed with surgery as scheduled.  Will hold supplements, ASA and NSAIDs accordingly. She can continue Plaquenil but will be holding Cinzia. Will remain NPO after midnight the night before surgery. Will present to [facility] for pre-op testing, scheduled for next week. Anticipate hospital stay to include at least 2 midnights given medical history and to ensure proper pain control. Plan Xarelto for DVT ppx post-op due to her ASA allergy. Plan Dilaudid, Tizanidine, Colace, Miralax. Plan to start outpt PT at [facility] in [place] immediately post-op (planning for 2/3, order placed today, they are to contact the patient to arrange and I instructed her to call me if she has not been set up for PT by the end of this week). Will follow up 10-14 days post-op for suture removal and xrays.  Anticipated LOS equal to or greater than 2 midnights due to - Age 36 and older with one or more of the following:  - Obesity  - Expected need for hospital services (PT, OT, Nursing) required for safe  discharge  - Anticipated need for postoperative skilled nursing care or inpatient rehab  - Active co-morbidities: None   Plan left total knee replacement  Cecilie Kicks, PA-C for Dr. Tonita Cong 07/11/2018, 11:30 AM

## 2018-07-11 NOTE — H&P (Signed)
[name] is an 68 y.o. female.   Chief Complaint: L knee pain HPI: The patient is here for her H & P. She is scheduled for a left total knee arthroplasty on 07/26/18 at [place] by Dr. Beane.  She reports chronic progressively worsening left knee pain refractory to injection therapy with both cortisone and Visco supplementation, bracing, quad strengthening, activity modifications, medications, relative rest. Symptoms at this point are interfering with activities of daily living and quality-of-life and she desires to proceed with total knee replacement.  Dr. Beane and the patient mutually agreed to proceed with a left total knee replacement. Risks and benefits of the procedure were discussed including stiffness, suboptimal range of motion, persistent pain, infection requiring removal of prosthesis and reinsertion, need for prophylactic antibiotics in the future, for example, dental procedures, possible need for manipulation, revision in the future and also anesthetic complications including DVT, PE, etc. We discussed the perioperative course, time in the hospital, postoperative recovery and the need for elevation to control swelling. We also discussed the predicted range of motion and the probability that squatting and kneeling would be unobtainable in the future. In addition, postoperative anticoagulation was discussed. We have obtained preoperative medical clearance as necessary. Provided illustrated handout and discussed it in detail. They will enroll in the total joint replacement educational forum at the hospital.  When we did her right knee replacement 2 years ago, we utilized Dilaudid by mouth postoperatively for pain control and she tolerated that well along with tramadol, around of Toradol for inflammation, tizanidine, Colace and MiraLAX. Since she has an aspirin allergy we did utilize Xarelto and she tolerated that well. She has had thrush with clindamycin in the past. When we did her total  knee on the right, she did immediate outpatient physical therapy at [facility] rocking him in [place] and did well with that. She would like to do the same again.  Past Medical History:  Diagnosis Date  . Acute meniscal tear of left knee   . Arthritis   . Asthma due to environmental allergies   . Complication of anesthesia   . Diverticulosis of sigmoid colon   . History of gastric ulcer   . Hypertension   . PONV (postoperative nausea and vomiting) 1980's  . RA (rheumatoid arthritis) ([facility])   . Swelling of right knee joint    S/P   ARTHROSCOPY -- 09-07-2013  . Wears glasses     Past Surgical History:  Procedure Laterality Date  . APPENDECTOMY  1976  . KNEE ARTHROSCOPY Left 12/07/2013   Procedure: ARTHROSCOPY LEFT KNEE WITH DEBRIDEMENT;  Surgeon: Jeffrey C Beane, MD;  Location: [facility];  Service: Orthopedics;  Laterality: Left;  . KNEE ARTHROSCOPY WITH LATERAL MENISECTOMY Right 09/07/2013   Procedure: RIGHT KNEE ARTHROSCOPY WITH PARTIAL MEDIAL AND LATERAL MENISECTOMY/DEBRIDEMENT/ ;  Surgeon: Jeffrey C Beane, MD;  Location: [facility];  Service: Orthopedics;  Laterality: Right;  . SHOULDER OPEN ROTATOR CUFF REPAIR Bilateral right  1993/   left  1998  . TARSAL TUNNEL RELEASE Bilateral right 2010/   left 2009   feet  . TOTAL KNEE ARTHROPLASTY Right 07/22/2016   Procedure: Right TOTAL KNEE ARTHROPLASTY;  Surgeon: Jeffrey Beane, MD;  Location[facility];  Service: Orthopedics;  Laterality: Right;  Requests 2 hours  . VAGINAL HYSTERECTOMY  1983    No family history on file. Social History:  reports that she quit smoking about 18 years ago. Her smoking use included cigarettes. She has   a 15.00 pack-year smoking history. She has never used smokeless tobacco. She reports that she does not drink alcohol or use drugs.  Allergies:  Allergies  Allergen Reactions  . Asa [Aspirin] Other (See Comments)    Avoids asa 325mg, causes right side facial numbness and gi upset/   Pt  can take asa 81mg with no issues  . Iohexol Hives    Patient needs 13 hour prep before ct scans with contrast  . Methadone Other (See Comments)    unknown  . Oxycodone Other (See Comments)    Caused sores/lesions in nose   . Sulfa Antibiotics Hives  . Zostavax [Zoster Vaccine Live] Other (See Comments)    Made pt feel sick with flu  . Pregabalin Other (See Comments)    Blisters in throat   Medications: albuterol sulfate 1.25 mg/3 mL solution for nebulization amLODIPine 5 mg tablet gabapentin 100 mg capsule hydroxychloroquine 200 mg tablet indapamide 1.25 mg tablet montelukast 10 mg tablet potassium chloride ER 10 mEq tablet,extended release Shingrix (PF) 50 mcg/0.5 mL intramuscular suspension, kit tiZANidine 4 mg tablet traMADoL 50 mg tablet zolpidem 10 mg tablet   Review of Systems  Constitutional: Negative.   HENT: Negative.   Eyes: Negative.   Respiratory: Negative.   Cardiovascular: Negative.   Gastrointestinal: Negative.   Genitourinary: Negative.   Musculoskeletal: Positive for back pain and joint pain.  Skin: Negative.   Neurological: Negative.   Psychiatric/Behavioral: Negative.     There were no vitals taken for this visit. Physical Exam  Constitutional: She is oriented to person, place, and time. She appears well-developed and well-nourished.  HENT:  Head: Normocephalic.  Eyes: Pupils are equal, round, and reactive to light.  Neck: Normal range of motion.  Cardiovascular: Normal rate.  Respiratory: Effort normal.  GI: Soft.  Musculoskeletal:     Comments: Patient is a 68-year-old female.  Constitutional: General Appearance: healthy-appearing and NAD.  Gait and Station: Appearance: antalgic gait.  Cardiovascular System: Arterial Pulses Left: femoral normal, popliteal normal, dorsalis pedis normal, and posterior tibialis normal. Edema Left: no edema. Varicosities Left: no varicosities.  Lymph Nodes: Inspection/Palpation Left: no inguinal  LAD.  Knees: Inspection Left: no deformity and swelling. Bony Palpation Left: no tenderness of the superior pole patella, the inferior pole patella, the tibial tubercle, the medial joint line, Gerdy's tubercle, or the neck of fibula and tenderness of the lateral joint line and the medial tibial plateau. Soft Tissue Palpation Left: no tenderness of the quadriceps tendon, the prepatellar bursa, the patellar tendon, the medial collateral ligament, or the infrapatellar tendon. Active Range of Motion Left: limited. Stability Left: no laxity or ligamentous instability and anterior drawer sign negative and Lachman test negative. Special Tests Left: McMurray's test negative. Strength Left: no hamstring weakness or quadriceps weakness and flexion 5/5 and extension 5/5.  Skin: Left Lower Extremity: normal.  Neurologic: Ankle Reflex Left: normal (2). Knee Reflex Left: normal (2). Sensation on the Left: L2 normal, L3 normal, L4 normal, L5 normal, and S1 normal.  Psychiatric: Mood and Affect: active and alert and normal mood.  Neurological: She is alert and oriented to person, place, and time.  Skin: Skin is dry.    MRI demonstrates severe osteoarthrosis of the medial compartment. Complex tear of the medial meniscus horizontal tearing.  Also some patellofemoral arthrosis.  Degenerative changes of the lateral compartment are noted. Small horizontal tear of the anterior horn of lateral meniscus.  Assessment/Plan Impression: Left knee end-stage osteoarthritis  Plan: Pt with end-stage Left   knee DJD, bone-on-bone, refractory to conservative tx, scheduled for Left total knee replacement by Dr. Beane on 07/26/2018. We again discussed the procedure itself as well as risks, complications and alternatives, including but not limited to DVT, PE, infx, bleeding, failure of procedure, need for secondary procedure including manipulation, nerve injury, ongoing pain/symptoms, anesthesia risk, even stroke or death. Also  discussed typical post-op protocols, activity restrictions, need for PT, flexion/extension exercises, time out of work. Discussed need for DVT ppx post-op per protocol. Discussed dental ppx and infx prevention. Also discussed limitations post-operatively such as kneeling and squatting. All questions were answered. Patient desires to proceed with surgery as scheduled.  Will hold supplements, ASA and NSAIDs accordingly. She can continue Plaquenil but will be holding Cinzia. Will remain NPO after midnight the night before surgery. Will present to [facility] for pre-op testing, scheduled for next week. Anticipate hospital stay to include at least 2 midnights given medical history and to ensure proper pain control. Plan Xarelto for DVT ppx post-op due to her ASA allergy. Plan Dilaudid, Tizanidine, Colace, Miralax. Plan to start outpt PT at [facility] in [place] immediately post-op (planning for 2/3, order placed today, they are to contact the patient to arrange and I instructed her to call me if she has not been set up for PT by the end of this week). Will follow up 10-14 days post-op for suture removal and xrays.  Anticipated LOS equal to or greater than 2 midnights due to - Age 65 and older with one or more of the following:  - Obesity  - Expected need for hospital services (PT, OT, Nursing) required for safe  discharge  - Anticipated need for postoperative skilled nursing care or inpatient rehab  - Active co-morbidities: None   Plan left total knee replacement   M , PA-C for Dr. Beane 07/11/2018, 11:30 AM   

## 2018-07-18 NOTE — Patient Instructions (Addendum)
[name]  07/18/2018   Your procedure is scheduled on: 07-26-18    Report to [facility] Main  Entrance    Report to Admitting at 7:00 AM    Call this number if you have problems the morning of surgery [phone]    Remember: Do not eat food or drink liquids :After Midnight.      Take these medicines the morning of surgery with A SIP OF WATER: Amlodipine (Norvasc), Ranitidine (Zantac), as needed. You may also use and bring your eyedrops as needed.    BRUSH YOUR TEETH MORNING OF SURGERY AND RINSE YOUR MOUTH OUT, NO CHEWING GUM CANDY OR MINTS.                                You may not have any metal on your body including hair pins and              piercings  Do not wear jewelry, make-up, lotions, powders or perfumes, deodorant             Do not wear nail polish.  Do not shave  48 hours prior to surgery.        Do not bring valuables to the hospital. [facility] IS NOT             RESPONSIBLE   FOR VALUABLES.  Contacts, dentures or bridgework may not be worn into surgery.  Leave suitcase in the car. After surgery it may be brought to your room.     Patients discharged the day of surgery will not be allowed to drive home. IF YOU ARE HAVING SURGERY AND GOING HOME THE SAME DAY, YOU MUST HAVE AN ADULT TO DRIVE YOU HOME AND BE WITH YOU FOR 24 HOURS. YOU MAY GO HOME BY TAXI OR UBER OR ORTHERWISE, BUT AN ADULT MUST ACCOMPANY YOU HOME AND STAY WITH YOU FOR 24 HOURS.      Special Instructions: N/A              Please read over the following fact sheets you were given: _____________________________________________________________________             [facility] - Preparing for Surgery Before surgery, you can play an important role.  Because skin is not sterile, your skin needs to be as free of germs as possible.  You can reduce the number of germs on your skin by washing with CHG (chlorahexidine gluconate) soap before surgery.  CHG is an antiseptic  cleaner which kills germs and bonds with the skin to continue killing germs even after washing. Please DO NOT use if you have an allergy to CHG or antibacterial soaps.  If your skin becomes reddened/irritated stop using the CHG and inform your nurse when you arrive at Short Stay. Do not shave (including legs and underarms) for at least 48 hours prior to the first CHG shower.  You may shave your face/neck. Please follow these instructions carefully:  1.  Shower with CHG Soap the night before surgery and the  morning of Surgery.  2.  If you choose to wash your hair, wash your hair first as usual with your  normal  shampoo.  3.  After you shampoo, rinse your hair and body thoroughly to remove the  shampoo.  4.  Use CHG as you would any other liquid soap.  You can apply chg directly  to the skin and wash                       Gently with a scrungie or clean washcloth.  5.  Apply the CHG Soap to your body ONLY FROM THE NECK DOWN.   Do not use on face/ open                           Wound or open sores. Avoid contact with eyes, ears mouth and genitals (private parts).                       Wash face,  Genitals (private parts) with your normal soap.             6.  Wash thoroughly, paying special attention to the area where your surgery  will be performed.  7.  Thoroughly rinse your body with warm water from the neck down.  8.  DO NOT shower/wash with your normal soap after using and rinsing off  the CHG Soap.                9.  Pat yourself dry with a clean towel.            10.  Wear clean pajamas.            11.  Place clean sheets on your bed the night of your first shower and do not  sleep with pets. Day of Surgery : Do not apply any lotions/deodorants the morning of surgery.  Please wear clean clothes to the hospital/surgery center.  FAILURE TO FOLLOW THESE INSTRUCTIONS MAY RESULT IN THE CANCELLATION OF YOUR SURGERY PATIENT  SIGNATURE_________________________________  NURSE SIGNATURE__________________________________  ________________________________________________________________________   Adam Phenix  An incentive spirometer is a tool that can help keep your lungs clear and active. This tool measures how well you are filling your lungs with each breath. Taking long deep breaths may help reverse or decrease the chance of developing breathing (pulmonary) problems (especially infection) following:  A long period of time when you are unable to move or be active. BEFORE THE PROCEDURE   If the spirometer includes an indicator to show your best effort, your nurse or respiratory therapist will set it to a desired goal.  If possible, sit up straight or lean slightly forward. Try not to slouch.  Hold the incentive spirometer in an upright position. INSTRUCTIONS FOR USE  1. Sit on the edge of your bed if possible, or sit up as far as you can in bed or on a chair. 2. Hold the incentive spirometer in an upright position. 3. Breathe out normally. 4. Place the mouthpiece in your mouth and seal your lips tightly around it. 5. Breathe in slowly and as deeply as possible, raising the piston or the ball toward the top of the column. 6. Hold your breath for 3-5 seconds or for as long as possible. Allow the piston or ball to fall to the bottom of the column. 7. Remove the mouthpiece from your mouth and breathe out normally. 8. Rest for a few seconds and repeat Steps 1 through 7 at least 10 times every 1-2 hours when you are awake. Take your time and take a few normal breaths between deep breaths. 9. The spirometer may include an indicator to show  your best effort. Use the indicator as a goal to work toward during each repetition. 10. After each set of 10 deep breaths, practice coughing to be sure your lungs are clear. If you have an incision (the cut made at the time of surgery), support your incision when coughing  by placing a pillow or rolled up towels firmly against it. Once you are able to get out of bed, walk around indoors and cough well. You may stop using the incentive spirometer when instructed by your caregiver.  RISKS AND COMPLICATIONS  Take your time so you do not get dizzy or light-headed.  If you are in pain, you may need to take or ask for pain medication before doing incentive spirometry. It is harder to take a deep breath if you are having pain. AFTER USE  Rest and breathe slowly and easily.  It can be helpful to keep track of a log of your progress. Your caregiver can provide you with a simple table to help with this. If you are using the spirometer at home, follow these instructions: [place] IF:   You are having difficultly using the spirometer.  You have trouble using the spirometer as often as instructed.  Your pain medication is not giving enough relief while using the spirometer.  You develop fever of 100.5 F (38.1 C) or higher. SEEK IMMEDIATE MEDICAL CARE IF:   You cough up bloody sputum that had not been present before.  You develop fever of 102 F (38.9 C) or greater.  You develop worsening pain at or near the incision site. MAKE SURE YOU:   Understand these instructions.  Will watch your condition.  Will get help right away if you are not doing well or get worse. Document Released: 10/25/2006 Document Revised: 09/06/2011 Document Reviewed: 12/26/2006 [facility] Patient Information 2014 [place], [place].   ________________________________________________________________________

## 2018-07-19 ENCOUNTER — Encounter (HOSPITAL_COMMUNITY)
Admission: RE | Admit: 2018-07-19 | Discharge: 2018-07-19 | Disposition: A | Discharge status: Home / Self Care | Payer: Medicare Other | Source: Ambulatory Visit | Attending: Specialist | Admitting: Specialist

## 2018-07-19 ENCOUNTER — Other Ambulatory Visit: Payer: Self-pay

## 2018-07-19 ENCOUNTER — Encounter (HOSPITAL_COMMUNITY): Payer: Self-pay

## 2018-07-19 DIAGNOSIS — I4510 Unspecified right bundle-branch block: Secondary | ICD-10-CM | POA: Insufficient documentation

## 2018-07-19 DIAGNOSIS — I10 Essential (primary) hypertension: Secondary | ICD-10-CM | POA: Diagnosis not present

## 2018-07-19 DIAGNOSIS — M1712 Unilateral primary osteoarthritis, left knee: Secondary | ICD-10-CM | POA: Diagnosis not present

## 2018-07-19 DIAGNOSIS — Z01818 Encounter for other preprocedural examination: Secondary | ICD-10-CM | POA: Insufficient documentation

## 2018-07-19 DIAGNOSIS — R9431 Abnormal electrocardiogram [ECG] [EKG]: Secondary | ICD-10-CM | POA: Insufficient documentation

## 2018-07-19 HISTORY — DX: Asymptomatic varicose veins of bilateral lower extremities: I83.93

## 2018-07-19 HISTORY — DX: Measles without complication: B05.9

## 2018-07-19 HISTORY — DX: Personal history of urinary calculi: Z87.442

## 2018-07-19 HISTORY — DX: Other intervertebral disc degeneration, lumbar region without mention of lumbar back pain or lower extremity pain: M51.369

## 2018-07-19 HISTORY — DX: Headache: R51

## 2018-07-19 HISTORY — DX: Zoster without complications: B02.9

## 2018-07-19 HISTORY — DX: Headache, unspecified: R51.9

## 2018-07-19 HISTORY — DX: Fibromyalgia: M79.7

## 2018-07-19 HISTORY — DX: Other intervertebral disc degeneration, lumbar region: M51.36

## 2018-07-19 HISTORY — DX: Mumps without complication: B26.9

## 2018-07-19 LAB — BASIC METABOLIC PANEL
Anion gap: 11 (ref 5–15)
BUN: 14 mg/dL (ref 8–23)
CO2: 28 mmol/L (ref 22–32)
Calcium: 9.5 mg/dL (ref 8.9–10.3)
Chloride: 99 mmol/L (ref 98–111)
Creatinine, Ser: 0.96 mg/dL (ref 0.44–1.00)
GFR calc Af Amer: >60 mL/min (ref >60)
Glucose, Bld: 92 mg/dL (ref 70–99)
Potassium: 3.4 mmol/L — ABNORMAL LOW (ref 3.5–5.1)
Sodium: 138 mmol/L (ref 135–145)

## 2018-07-19 LAB — CBC
HCT: 42.9 % (ref 36.0–46.0)
Hemoglobin: 14.3 g/dL (ref 12.0–15.0)
MCH: 32.5 pg (ref 26.0–34.0)
MCHC: 33.3 g/dL (ref 30.0–36.0)
MCV: 97.5 fL (ref 80.0–100.0)
Platelets: 262 10*3/uL (ref 150–400)
RBC: 4.4 MIL/uL (ref 3.87–5.11)
RDW: 13.7 % (ref 11.5–15.5)
WBC: 6.2 10*3/uL (ref 4.0–10.5)
nRBC: 0 % (ref 0.0–0.2)

## 2018-07-19 LAB — URINALYSIS, ROUTINE W REFLEX MICROSCOPIC
Bilirubin Urine: NEGATIVE
GLUCOSE, UA: NEGATIVE mg/dL
Hgb urine dipstick: NEGATIVE
Ketones, ur: NEGATIVE mg/dL
Leukocytes, UA: NEGATIVE
Nitrite: NEGATIVE
Protein, ur: NEGATIVE mg/dL
Specific Gravity, Urine: 1.017 (ref 1.005–1.030)
pH: 8 (ref 5.0–8.0)

## 2018-07-19 LAB — SURGICAL PCR SCREEN
MRSA, PCR: NEGATIVE
Staphylococcus aureus: POSITIVE — AB

## 2018-07-19 LAB — PROTIME-INR
INR: 0.94
Prothrombin Time: 12.5 seconds (ref 11.4–15.2)

## 2018-07-19 LAB — APTT: APTT: 51 s — AB (ref 24–36)

## 2018-07-20 ENCOUNTER — Ambulatory Visit: Payer: Self-pay | Admitting: Orthopedic Surgery

## 2018-07-26 ENCOUNTER — Inpatient Hospital Stay (HOSPITAL_COMMUNITY): Payer: Medicare Other | Admitting: Anesthesiology

## 2018-07-26 ENCOUNTER — Encounter (HOSPITAL_COMMUNITY): Payer: Self-pay | Admitting: Emergency Medicine

## 2018-07-26 ENCOUNTER — Inpatient Hospital Stay (HOSPITAL_COMMUNITY): Payer: Medicare Other

## 2018-07-26 ENCOUNTER — Inpatient Hospital Stay (HOSPITAL_COMMUNITY): Payer: Medicare Other | Admitting: Physician Assistant

## 2018-07-26 ENCOUNTER — Other Ambulatory Visit: Payer: Self-pay

## 2018-07-26 ENCOUNTER — Encounter (HOSPITAL_COMMUNITY)
Admission: RE | Disposition: A | Discharge status: Home Health | Payer: Self-pay | Source: Home / Self Care | Attending: Specialist

## 2018-07-26 ENCOUNTER — Inpatient Hospital Stay (HOSPITAL_COMMUNITY)
Admission: RE | Admit: 2018-07-26 | Discharge: 2018-07-31 | DRG: 470 | Disposition: A | Discharge status: Home Health | Payer: Medicare Other | Attending: Specialist | Admitting: Specialist

## 2018-07-26 DIAGNOSIS — G43909 Migraine, unspecified, not intractable, without status migrainosus: Secondary | ICD-10-CM | POA: Diagnosis present

## 2018-07-26 DIAGNOSIS — M1712 Unilateral primary osteoarthritis, left knee: Principal | ICD-10-CM

## 2018-07-26 DIAGNOSIS — M069 Rheumatoid arthritis, unspecified: Secondary | ICD-10-CM | POA: Diagnosis present

## 2018-07-26 DIAGNOSIS — Z885 Allergy status to narcotic agent status: Secondary | ICD-10-CM

## 2018-07-26 DIAGNOSIS — Z96651 Presence of right artificial knee joint: Secondary | ICD-10-CM | POA: Diagnosis present

## 2018-07-26 DIAGNOSIS — Z96659 Presence of unspecified artificial knee joint: Secondary | ICD-10-CM

## 2018-07-26 DIAGNOSIS — I10 Essential (primary) hypertension: Secondary | ICD-10-CM | POA: Diagnosis present

## 2018-07-26 DIAGNOSIS — Z6827 Body mass index (BMI) 27.0-27.9, adult: Secondary | ICD-10-CM | POA: Diagnosis not present

## 2018-07-26 DIAGNOSIS — Z8711 Personal history of peptic ulcer disease: Secondary | ICD-10-CM

## 2018-07-26 DIAGNOSIS — K5730 Diverticulosis of large intestine without perforation or abscess without bleeding: Secondary | ICD-10-CM | POA: Diagnosis present

## 2018-07-26 DIAGNOSIS — Z882 Allergy status to sulfonamides status: Secondary | ICD-10-CM

## 2018-07-26 DIAGNOSIS — M5136 Other intervertebral disc degeneration, lumbar region: Secondary | ICD-10-CM | POA: Diagnosis present

## 2018-07-26 DIAGNOSIS — Z87442 Personal history of urinary calculi: Secondary | ICD-10-CM

## 2018-07-26 DIAGNOSIS — J45909 Unspecified asthma, uncomplicated: Secondary | ICD-10-CM | POA: Diagnosis present

## 2018-07-26 DIAGNOSIS — Z888 Allergy status to other drugs, medicaments and biological substances status: Secondary | ICD-10-CM | POA: Diagnosis not present

## 2018-07-26 DIAGNOSIS — Z886 Allergy status to analgesic agent status: Secondary | ICD-10-CM | POA: Diagnosis not present

## 2018-07-26 DIAGNOSIS — Z87891 Personal history of nicotine dependence: Secondary | ICD-10-CM | POA: Diagnosis not present

## 2018-07-26 DIAGNOSIS — M25562 Pain in left knee: Secondary | ICD-10-CM | POA: Diagnosis present

## 2018-07-26 DIAGNOSIS — E669 Obesity, unspecified: Secondary | ICD-10-CM | POA: Diagnosis present

## 2018-07-26 DIAGNOSIS — M797 Fibromyalgia: Secondary | ICD-10-CM | POA: Diagnosis present

## 2018-07-26 HISTORY — PX: TOTAL KNEE ARTHROPLASTY: SHX125

## 2018-07-26 SURGERY — ARTHROPLASTY, KNEE, TOTAL
Anesthesia: General | Site: Knee | Laterality: Left

## 2018-07-26 MED ORDER — MONTELUKAST SODIUM 10 MG PO TABS: 10.0000 mg | ORAL_TABLET | Freq: Every evening | ORAL | Status: DC | PRN

## 2018-07-26 MED ORDER — FAMOTIDINE 20 MG PO TABS
20.0000 mg | ORAL_TABLET | Freq: Every day | ORAL | Status: DC
  Administered 2018-07-27 – 2018-07-31 (×5): 20 mg via ORAL
  Filled 2018-07-26 (×5): qty 1

## 2018-07-26 MED ORDER — RIVAROXABAN 10 MG PO TABS: 10.0000 mg | ORAL_TABLET | Freq: Every day | ORAL | 0 refills | Status: DC

## 2018-07-26 MED ORDER — GABAPENTIN 300 MG PO CAPS
300.0000 mg | ORAL_CAPSULE | Freq: Once | ORAL | Status: AC
  Administered 2018-07-26: 300 mg via ORAL
  Filled 2018-07-26: qty 1

## 2018-07-26 MED ORDER — FENTANYL CITRATE (PF) 100 MCG/2ML IJ SOLN
INTRAMUSCULAR | Status: AC
  Filled 2018-07-26: qty 2

## 2018-07-26 MED ORDER — ONDANSETRON HCL 4 MG/2ML IJ SOLN
INTRAMUSCULAR | Status: DC | PRN
  Administered 2018-07-26: 4 mg via INTRAVENOUS

## 2018-07-26 MED ORDER — PROPOFOL 10 MG/ML IV BOLUS
INTRAVENOUS | Status: AC
  Filled 2018-07-26: qty 40

## 2018-07-26 MED ORDER — POLYETHYLENE GLYCOL 3350 17 G PO PACK: 17.0000 g | PACK | Freq: Every day | ORAL | 0 refills | Status: DC

## 2018-07-26 MED ORDER — CEFAZOLIN SODIUM-DEXTROSE 2-4 GM/100ML-% IV SOLN
2.0000 g | Freq: Four times a day (QID) | INTRAVENOUS | Status: AC
  Administered 2018-07-26 (×2): 2 g via INTRAVENOUS
  Filled 2018-07-26 (×3): qty 100

## 2018-07-26 MED ORDER — MIDAZOLAM HCL 2 MG/2ML IJ SOLN
0.5000 mg | INTRAMUSCULAR | Status: DC
  Administered 2018-07-26: 1 mg via INTRAVENOUS
  Filled 2018-07-26: qty 2

## 2018-07-26 MED ORDER — METHOCARBAMOL 500 MG PO TABS
500.0000 mg | ORAL_TABLET | Freq: Four times a day (QID) | ORAL | Status: DC | PRN
  Administered 2018-07-27 – 2018-07-30 (×11): 500 mg via ORAL
  Filled 2018-07-26 (×11): qty 1

## 2018-07-26 MED ORDER — LACTATED RINGERS IV SOLN
INTRAVENOUS | Status: DC
  Administered 2018-07-26: 08:00:00 via INTRAVENOUS

## 2018-07-26 MED ORDER — HYDROMORPHONE HCL 1 MG/ML IJ SOLN: 0.2500 mg | INTRAMUSCULAR | Status: DC | PRN

## 2018-07-26 MED ORDER — SUGAMMADEX SODIUM 200 MG/2ML IV SOLN
INTRAVENOUS | Status: AC
  Filled 2018-07-26: qty 2

## 2018-07-26 MED ORDER — TRAMADOL HCL 50 MG PO TABS
50.0000 mg | ORAL_TABLET | Freq: Four times a day (QID) | ORAL | Status: DC | PRN
  Administered 2018-07-26 – 2018-07-27 (×3): 50 mg via ORAL
  Filled 2018-07-26 (×3): qty 1

## 2018-07-26 MED ORDER — MENTHOL 3 MG MT LOZG
1.0000 | LOZENGE | OROMUCOSAL | Status: DC | PRN
  Filled 2018-07-26: qty 9

## 2018-07-26 MED ORDER — HYDROMORPHONE HCL 2 MG PO TABS
2.0000 mg | ORAL_TABLET | ORAL | Status: DC | PRN
  Administered 2018-07-27: 4 mg via ORAL
  Administered 2018-07-27 (×3): 2 mg via ORAL
  Administered 2018-07-27 – 2018-07-28 (×3): 4 mg via ORAL
  Administered 2018-07-28: 2 mg via ORAL
  Administered 2018-07-28 (×2): 4 mg via ORAL
  Administered 2018-07-29: 2 mg via ORAL
  Administered 2018-07-29: 4 mg via ORAL
  Administered 2018-07-29: 2 mg via ORAL
  Administered 2018-07-30: 4 mg via ORAL
  Administered 2018-07-30: 2 mg via ORAL
  Administered 2018-07-30 – 2018-07-31 (×2): 4 mg via ORAL
  Filled 2018-07-26: qty 2
  Filled 2018-07-26 (×2): qty 1
  Filled 2018-07-26 (×3): qty 2
  Filled 2018-07-26: qty 1
  Filled 2018-07-26: qty 2
  Filled 2018-07-26: qty 1
  Filled 2018-07-26 (×3): qty 2
  Filled 2018-07-26: qty 1
  Filled 2018-07-26: qty 2
  Filled 2018-07-26: qty 1
  Filled 2018-07-26 (×3): qty 2

## 2018-07-26 MED ORDER — ACETAMINOPHEN 10 MG/ML IV SOLN: 1000.0000 mg | Freq: Once | INTRAVENOUS | Status: DC | PRN

## 2018-07-26 MED ORDER — ESMOLOL HCL 100 MG/10ML IV SOLN
INTRAVENOUS | Status: AC
  Filled 2018-07-26: qty 10

## 2018-07-26 MED ORDER — CYCLOSPORINE 0.05 % OP EMUL
1.0000 [drp] | Freq: Two times a day (BID) | OPHTHALMIC | Status: DC
  Administered 2018-07-26 – 2018-07-31 (×10): 1 [drp] via OPHTHALMIC
  Filled 2018-07-26 (×10): qty 1

## 2018-07-26 MED ORDER — BUPIVACAINE-EPINEPHRINE 0.25% -1:200000 IJ SOLN
INTRAMUSCULAR | Status: DC | PRN
  Administered 2018-07-26: 30 mL

## 2018-07-26 MED ORDER — ESMOLOL HCL 100 MG/10ML IV SOLN
INTRAVENOUS | Status: DC | PRN
  Administered 2018-07-26: 20 mg via INTRAVENOUS

## 2018-07-26 MED ORDER — DEXAMETHASONE SODIUM PHOSPHATE 10 MG/ML IJ SOLN
INTRAMUSCULAR | Status: AC
  Filled 2018-07-26: qty 1

## 2018-07-26 MED ORDER — METOCLOPRAMIDE HCL 5 MG/ML IJ SOLN: 5.0000 mg | Freq: Three times a day (TID) | INTRAMUSCULAR | Status: DC | PRN

## 2018-07-26 MED ORDER — SODIUM CHLORIDE 0.9 % IR SOLN
Status: DC | PRN
  Administered 2018-07-26: 1000 mL

## 2018-07-26 MED ORDER — HYDROCODONE-ACETAMINOPHEN 7.5-325 MG PO TABS: 1.0000 | ORAL_TABLET | Freq: Once | ORAL | Status: DC | PRN

## 2018-07-26 MED ORDER — ONDANSETRON HCL 4 MG PO TABS: 4.0000 mg | ORAL_TABLET | Freq: Four times a day (QID) | ORAL | Status: DC | PRN

## 2018-07-26 MED ORDER — EPHEDRINE 5 MG/ML INJ
INTRAVENOUS | Status: AC
  Filled 2018-07-26: qty 20

## 2018-07-26 MED ORDER — POTASSIUM CHLORIDE CRYS ER 10 MEQ PO TBCR
10.0000 meq | EXTENDED_RELEASE_TABLET | Freq: Three times a day (TID) | ORAL | Status: DC
  Administered 2018-07-26 – 2018-07-31 (×15): 10 meq via ORAL
  Filled 2018-07-26 (×14): qty 1

## 2018-07-26 MED ORDER — PROPOFOL 10 MG/ML IV BOLUS
INTRAVENOUS | Status: DC | PRN
  Administered 2018-07-26: 120 mg via INTRAVENOUS

## 2018-07-26 MED ORDER — SODIUM CHLORIDE 0.9 % IV SOLN
INTRAVENOUS | Status: DC | PRN
  Administered 2018-07-26: 500 mL

## 2018-07-26 MED ORDER — DOCUSATE SODIUM 100 MG PO CAPS
100.0000 mg | ORAL_CAPSULE | Freq: Two times a day (BID) | ORAL | Status: DC
  Administered 2018-07-26 – 2018-07-30 (×8): 100 mg via ORAL
  Filled 2018-07-26 (×8): qty 1

## 2018-07-26 MED ORDER — DEXAMETHASONE SODIUM PHOSPHATE 10 MG/ML IJ SOLN
INTRAMUSCULAR | Status: DC | PRN
  Administered 2018-07-26: 4 mg via INTRAVENOUS

## 2018-07-26 MED ORDER — ZOLPIDEM TARTRATE 5 MG PO TABS
5.0000 mg | ORAL_TABLET | Freq: Every day | ORAL | Status: DC
  Administered 2018-07-27 – 2018-07-30 (×4): 5 mg via ORAL
  Filled 2018-07-26 (×4): qty 1

## 2018-07-26 MED ORDER — METOCLOPRAMIDE HCL 5 MG PO TABS: 5.0000 mg | ORAL_TABLET | Freq: Three times a day (TID) | ORAL | Status: DC | PRN

## 2018-07-26 MED ORDER — ACETAMINOPHEN 500 MG PO TABS
1000.0000 mg | ORAL_TABLET | Freq: Four times a day (QID) | ORAL | Status: AC
  Administered 2018-07-26 – 2018-07-27 (×4): 1000 mg via ORAL
  Filled 2018-07-26 (×4): qty 2

## 2018-07-26 MED ORDER — MAGNESIUM CITRATE PO SOLN: 1.0000 | Freq: Once | ORAL | Status: DC | PRN

## 2018-07-26 MED ORDER — ONDANSETRON HCL 4 MG/2ML IJ SOLN: 4.0000 mg | Freq: Once | INTRAMUSCULAR | Status: DC | PRN

## 2018-07-26 MED ORDER — HYDROMORPHONE HCL 1 MG/ML IJ SOLN
0.5000 mg | INTRAMUSCULAR | Status: DC | PRN
  Administered 2018-07-27 – 2018-07-28 (×2): 1 mg via INTRAVENOUS
  Filled 2018-07-26 (×2): qty 1

## 2018-07-26 MED ORDER — MIDAZOLAM HCL 2 MG/2ML IJ SOLN
INTRAMUSCULAR | Status: AC
  Filled 2018-07-26: qty 2

## 2018-07-26 MED ORDER — HYDROXYCHLOROQUINE SULFATE 200 MG PO TABS
200.0000 mg | ORAL_TABLET | Freq: Every day | ORAL | Status: DC
  Administered 2018-07-27 – 2018-07-31 (×5): 200 mg via ORAL
  Filled 2018-07-26 (×5): qty 1

## 2018-07-26 MED ORDER — METHOCARBAMOL 500 MG IVPB - SIMPLE MED
500.0000 mg | Freq: Four times a day (QID) | INTRAVENOUS | Status: DC | PRN
  Administered 2018-07-26: 500 mg via INTRAVENOUS
  Filled 2018-07-26: qty 500
  Filled 2018-07-26: qty 50

## 2018-07-26 MED ORDER — EPHEDRINE SULFATE-NACL 50-0.9 MG/10ML-% IV SOSY
PREFILLED_SYRINGE | INTRAVENOUS | Status: DC | PRN
  Administered 2018-07-26: 10 mg via INTRAVENOUS
  Administered 2018-07-26: 5 mg via INTRAVENOUS
  Administered 2018-07-26 (×5): 10 mg via INTRAVENOUS

## 2018-07-26 MED ORDER — DIPHENHYDRAMINE HCL 12.5 MG/5ML PO ELIX: 12.5000 mg | ORAL_SOLUTION | ORAL | Status: DC | PRN

## 2018-07-26 MED ORDER — AMLODIPINE BESYLATE 5 MG PO TABS
5.0000 mg | ORAL_TABLET | Freq: Every day | ORAL | Status: DC
  Administered 2018-07-27 – 2018-07-31 (×5): 5 mg via ORAL
  Filled 2018-07-26 (×5): qty 1

## 2018-07-26 MED ORDER — PROPOFOL 10 MG/ML IV BOLUS
INTRAVENOUS | Status: AC
  Filled 2018-07-26: qty 20

## 2018-07-26 MED ORDER — SODIUM CHLORIDE 0.9 % IV SOLN
INTRAVENOUS | Status: AC
  Filled 2018-07-26: qty 500000

## 2018-07-26 MED ORDER — ONDANSETRON HCL 4 MG/2ML IJ SOLN: 4.0000 mg | Freq: Four times a day (QID) | INTRAMUSCULAR | Status: DC | PRN

## 2018-07-26 MED ORDER — ONDANSETRON HCL 4 MG/2ML IJ SOLN
INTRAMUSCULAR | Status: AC
  Filled 2018-07-26: qty 2

## 2018-07-26 MED ORDER — BUPIVACAINE-EPINEPHRINE (PF) 0.25% -1:200000 IJ SOLN
INTRAMUSCULAR | Status: AC
  Filled 2018-07-26: qty 30

## 2018-07-26 MED ORDER — ALBUTEROL SULFATE (2.5 MG/3ML) 0.083% IN NEBU: 2.5000 mg | INHALATION_SOLUTION | Freq: Four times a day (QID) | RESPIRATORY_TRACT | Status: DC | PRN

## 2018-07-26 MED ORDER — FENTANYL CITRATE (PF) 250 MCG/5ML IJ SOLN
INTRAMUSCULAR | Status: DC | PRN
  Administered 2018-07-26: 100 ug via INTRAVENOUS
  Administered 2018-07-26: 50 ug via INTRAVENOUS

## 2018-07-26 MED ORDER — TRANEXAMIC ACID-NACL 1000-0.7 MG/100ML-% IV SOLN
1000.0000 mg | INTRAVENOUS | Status: AC
  Administered 2018-07-26: 1000 mg via INTRAVENOUS
  Filled 2018-07-26: qty 100

## 2018-07-26 MED ORDER — HYDROMORPHONE HCL 2 MG PO TABS: 2.0000 mg | ORAL_TABLET | ORAL | 0 refills | Status: DC | PRN

## 2018-07-26 MED ORDER — SUGAMMADEX SODIUM 200 MG/2ML IV SOLN
INTRAVENOUS | Status: DC | PRN
  Administered 2018-07-26: 150 mg via INTRAVENOUS

## 2018-07-26 MED ORDER — KCL IN DEXTROSE-NACL 20-5-0.45 MEQ/L-%-% IV SOLN
INTRAVENOUS | Status: AC
  Administered 2018-07-26: 16:00:00 via INTRAVENOUS
  Filled 2018-07-26 (×2): qty 1000

## 2018-07-26 MED ORDER — RIVAROXABAN 10 MG PO TABS
10.0000 mg | ORAL_TABLET | Freq: Every day | ORAL | Status: DC
  Administered 2018-07-27 – 2018-07-31 (×5): 10 mg via ORAL
  Filled 2018-07-26 (×5): qty 1

## 2018-07-26 MED ORDER — ROCURONIUM BROMIDE 10 MG/ML (PF) SYRINGE
PREFILLED_SYRINGE | INTRAVENOUS | Status: DC | PRN
  Administered 2018-07-26: 40 mg via INTRAVENOUS

## 2018-07-26 MED ORDER — MIDAZOLAM HCL 2 MG/2ML IJ SOLN
INTRAMUSCULAR | Status: DC | PRN
  Administered 2018-07-26: 0.5 mg via INTRAVENOUS

## 2018-07-26 MED ORDER — ROCURONIUM BROMIDE 100 MG/10ML IV SOLN
INTRAVENOUS | Status: AC
  Filled 2018-07-26: qty 1

## 2018-07-26 MED ORDER — RISAQUAD PO CAPS
1.0000 | ORAL_CAPSULE | Freq: Every day | ORAL | Status: DC
  Administered 2018-07-26 – 2018-07-31 (×6): 1 via ORAL
  Filled 2018-07-26 (×6): qty 1

## 2018-07-26 MED ORDER — PHENOL 1.4 % MT LIQD: 1.0000 | OROMUCOSAL | Status: DC | PRN

## 2018-07-26 MED ORDER — BISACODYL 5 MG PO TBEC: 5.0000 mg | DELAYED_RELEASE_TABLET | Freq: Every day | ORAL | Status: DC | PRN

## 2018-07-26 MED ORDER — DOCUSATE SODIUM 100 MG PO CAPS: 100.0000 mg | ORAL_CAPSULE | Freq: Two times a day (BID) | ORAL | 2 refills | Status: AC

## 2018-07-26 MED ORDER — FENTANYL CITRATE (PF) 100 MCG/2ML IJ SOLN
50.0000 ug | INTRAMUSCULAR | Status: DC
  Administered 2018-07-26: 50 ug via INTRAVENOUS
  Filled 2018-07-26: qty 2

## 2018-07-26 MED ORDER — ACETAMINOPHEN 10 MG/ML IV SOLN
1000.0000 mg | INTRAVENOUS | Status: AC
  Administered 2018-07-26: 1000 mg via INTRAVENOUS
  Filled 2018-07-26: qty 100

## 2018-07-26 MED ORDER — INDAPAMIDE 1.25 MG PO TABS
1.2500 mg | ORAL_TABLET | Freq: Every day | ORAL | Status: DC
  Administered 2018-07-27 – 2018-07-31 (×5): 1.25 mg via ORAL
  Filled 2018-07-26 (×5): qty 1

## 2018-07-26 MED ORDER — POLYETHYLENE GLYCOL 3350 17 G PO PACK
17.0000 g | PACK | Freq: Every day | ORAL | Status: DC
  Administered 2018-07-27 – 2018-07-30 (×4): 17 g via ORAL
  Filled 2018-07-26 (×5): qty 1

## 2018-07-26 MED ORDER — CEFAZOLIN SODIUM-DEXTROSE 2-4 GM/100ML-% IV SOLN
2.0000 g | INTRAVENOUS | Status: AC
  Administered 2018-07-26: 2 g via INTRAVENOUS
  Filled 2018-07-26: qty 100

## 2018-07-26 MED ORDER — ALUM & MAG HYDROXIDE-SIMETH 200-200-20 MG/5ML PO SUSP: 30.0000 mL | ORAL | Status: DC | PRN

## 2018-07-26 MED ORDER — ACETAMINOPHEN 325 MG PO TABS
325.0000 mg | ORAL_TABLET | Freq: Four times a day (QID) | ORAL | Status: DC | PRN
  Administered 2018-07-29: 650 mg via ORAL
  Filled 2018-07-26: qty 2

## 2018-07-26 MED ORDER — LIDOCAINE 2% (20 MG/ML) 5 ML SYRINGE
INTRAMUSCULAR | Status: DC | PRN
  Administered 2018-07-26: 100 mg via INTRAVENOUS

## 2018-07-26 SURGICAL SUPPLY — 78 items
ATTUNE PSFEM LTSZ4 NARCEM KNEE (Femur) ×2 IMPLANT
ATTUNE PSRP INSR SZ4 6 KNEE (Insert) ×1 IMPLANT
ATTUNE PSRP INSR SZ4 6MM KNEE (Insert) ×1 IMPLANT
BAG DECANTER FOR FLEXI CONT (MISCELLANEOUS) ×3 IMPLANT
BAG ZIPLOCK 12X15 (MISCELLANEOUS) IMPLANT
BANDAGE ACE 4X5 VEL STRL LF (GAUZE/BANDAGES/DRESSINGS) ×3 IMPLANT
BANDAGE ACE 6X5 VEL STRL LF (GAUZE/BANDAGES/DRESSINGS) ×3 IMPLANT
BANDAGE ELASTIC 4 VELCRO ST LF (GAUZE/BANDAGES/DRESSINGS) ×2 IMPLANT
BASEPLATE TIBIAL ROTATING SZ 4 (Knees) ×2 IMPLANT
BLADE SAG 18X100X1.27 (BLADE) ×3 IMPLANT
BLADE SAW SGTL 11.0X1.19X90.0M (BLADE) ×3 IMPLANT
BLADE SAW SGTL 13.0X1.19X90.0M (BLADE) ×3 IMPLANT
BLADE SURG SZ10 CARB STEEL (BLADE) ×6 IMPLANT
BNDG ELASTIC 6X10 VLCR STRL LF (GAUZE/BANDAGES/DRESSINGS) ×2 IMPLANT
CEMENT HV SMART SET (Cement) ×4 IMPLANT
CLOSURE WOUND 1/2 X4 (GAUZE/BANDAGES/DRESSINGS)
CLOTH 2% CHLOROHEXIDINE 3PK (PERSONAL CARE ITEMS) ×3 IMPLANT
COVER SURGICAL LIGHT HANDLE (MISCELLANEOUS) ×3 IMPLANT
COVER WAND RF STERILE (DRAPES) ×2 IMPLANT
CUFF TOURN SGL QUICK 34 (TOURNIQUET CUFF) ×2
CUFF TRNQT CYL 34X4X40X1 (TOURNIQUET CUFF) ×1 IMPLANT
DECANTER SPIKE VIAL GLASS SM (MISCELLANEOUS) ×3 IMPLANT
DRAPE INCISE IOBAN 66X45 STRL (DRAPES) IMPLANT
DRAPE ORTHO SPLIT 77X108 STRL (DRAPES) ×4
DRAPE SHEET LG 3/4 BI-LAMINATE (DRAPES) ×6 IMPLANT
DRAPE SURG ORHT 6 SPLT 77X108 (DRAPES) ×2 IMPLANT
DRAPE U-SHAPE 47X51 STRL (DRAPES) ×3 IMPLANT
DRSG AQUACEL AG ADV 3.5X10 (GAUZE/BANDAGES/DRESSINGS) ×2 IMPLANT
DRSG TEGADERM 4X4.75 (GAUZE/BANDAGES/DRESSINGS) IMPLANT
DURAPREP 26ML APPLICATOR (WOUND CARE) ×3 IMPLANT
ELECT BLADE TIP CTD 4 INCH (ELECTRODE) ×3 IMPLANT
ELECT REM PT RETURN 15FT ADLT (MISCELLANEOUS) ×3 IMPLANT
EVACUATOR 1/8 PVC DRAIN (DRAIN) IMPLANT
GAUZE SPONGE 2X2 8PLY STRL LF (GAUZE/BANDAGES/DRESSINGS) IMPLANT
GLOVE BIOGEL PI IND STRL 7.5 (GLOVE) ×1 IMPLANT
GLOVE BIOGEL PI IND STRL 8 (GLOVE) ×1 IMPLANT
GLOVE BIOGEL PI INDICATOR 7.5 (GLOVE) ×2
GLOVE BIOGEL PI INDICATOR 8 (GLOVE) ×2
GLOVE SURG SS PI 7.5 STRL IVOR (GLOVE) ×6 IMPLANT
GLOVE SURG SS PI 8.0 STRL IVOR (GLOVE) ×6 IMPLANT
GOWN STRL REUS W/TWL XL LVL3 (GOWN DISPOSABLE) ×6 IMPLANT
HANDPIECE INTERPULSE COAX TIP (DISPOSABLE) ×2
HEMOSTAT SPONGE AVITENE ULTRA (HEMOSTASIS) ×3 IMPLANT
HOLDER FOLEY CATH W/STRAP (MISCELLANEOUS) IMPLANT
IMMOBILIZER KNEE 20 (SOFTGOODS) ×5 IMPLANT
IMMOBILIZER KNEE 20 THIGH 36 (SOFTGOODS) ×1 IMPLANT
MANIFOLD NEPTUNE II (INSTRUMENTS) ×3 IMPLANT
NDL SAFETY ECLIPSE 18X1.5 (NEEDLE) IMPLANT
NEEDLE HYPO 18GX1.5 SHARP (NEEDLE)
NS IRRIG 1000ML POUR BTL (IV SOLUTION) IMPLANT
PACK TOTAL KNEE CUSTOM (KITS) ×3 IMPLANT
PATELLA MEDIAL ATTUN 35MM KNEE (Knees) ×2 IMPLANT
PIN STEINMAN FIXATION KNEE (PIN) ×2 IMPLANT
PROTECTOR NERVE ULNAR (MISCELLANEOUS) ×3 IMPLANT
SEALER BIPOLAR AQUA 6.0 (INSTRUMENTS) IMPLANT
SET HNDPC FAN SPRY TIP SCT (DISPOSABLE) ×1 IMPLANT
SPONGE GAUZE 2X2 STER 10/PKG (GAUZE/BANDAGES/DRESSINGS)
SPONGE SURGIFOAM ABS GEL 100 (HEMOSTASIS) IMPLANT
STAPLER VISISTAT (STAPLE) IMPLANT
STRIP CLOSURE SKIN 1/2X4 (GAUZE/BANDAGES/DRESSINGS) IMPLANT
SUT BONE WAX W31G (SUTURE) IMPLANT
SUT MNCRL AB 4-0 PS2 18 (SUTURE) IMPLANT
SUT STRATAFIX 0 PDS 27 VIOLET (SUTURE) ×3
SUT VIC AB 1 CT1 27 (SUTURE) ×4
SUT VIC AB 1 CT1 27XBRD ANTBC (SUTURE) ×2 IMPLANT
SUT VIC AB 1 CTX 36 (SUTURE)
SUT VIC AB 1 CTX36XBRD ANBCTR (SUTURE) IMPLANT
SUT VIC AB 2-0 CT1 27 (SUTURE) ×6
SUT VIC AB 2-0 CT1 TAPERPNT 27 (SUTURE) ×3 IMPLANT
SUTURE STRATFX 0 PDS 27 VIOLET (SUTURE) ×1 IMPLANT
SYR 3ML LL SCALE MARK (SYRINGE) IMPLANT
SYR 50ML LL SCALE MARK (SYRINGE) IMPLANT
TAPE STRIPS DRAPE STRL (GAUZE/BANDAGES/DRESSINGS) ×2 IMPLANT
TOWER CARTRIDGE SMART MIX (DISPOSABLE) ×3 IMPLANT
TRAY FOLEY MTR SLVR 16FR STAT (SET/KITS/TRAYS/PACK) ×3 IMPLANT
WATER STERILE IRR 1000ML POUR (IV SOLUTION) ×3 IMPLANT
WRAP KNEE MAXI GEL POST OP (GAUZE/BANDAGES/DRESSINGS) ×3 IMPLANT
YANKAUER SUCT BULB TIP 10FT TU (MISCELLANEOUS) ×3 IMPLANT

## 2018-07-26 NOTE — Progress Notes (Signed)
AssistedDr. Houser with left, ultrasound guided, adductor canal block. Side rails up, monitors on throughout procedure. See vital signs in flow sheet. Tolerated Procedure well.  

## 2018-07-26 NOTE — Op Note (Signed)
NAME: [name], [name] MEDICAL RECORD [id number] ACCOUNT [id number] DATE OF BIRTH:12-Apr-1951 FACILITY: [facility] LOCATION: [facility] PHYSICIAN:Zorah Backes Connye Burkitt, MD  OPERATIVE REPORT  DATE OF PROCEDURE:  07/26/2018  PREOPERATIVE DIAGNOSIS:  End-stage osteoarthrosis of the left knee.  POSTOPERATIVE DIAGNOSIS:  End-stage osteoarthrosis of the left knee.  PROCEDURE PERFORMED:  Left total knee arthroplasty utilizing DePuy Attune rotating platform, 4 femur, 4 tibia, 6 mm insert, 35 patella.  ANESTHESIA:  General.  ASSISTANT:  Andrez Grime, PA  HISTORY:  A 68 year old with bone-on-bone arthrosis medial compartment.  Negative affect to her activities of daily living.  Family conservative treatment, indicated for replacement of the degenerative joint.  Risks and benefits discussed including  bleeding, infection, damage to neurovascular structures, no change in symptoms, worsening symptoms, DVT, PE, anesthetic complications, etc.  TECHNIQUE:  With the patient in supine position after induction of adequate general anesthesia and 2 g Kefzol, the left lower extremity was prepped and draped and exsanguinated in usual sterile fashion.  Thigh tourniquet inflated to 250 mmHg.  Midline  incision was made over the knee.  Full-thickness flaps developed.  Median parapatellar arthrotomy performed.  Soft tissue elevated medially, preserving the MCL.  Knee was flexed.  Tricompartmental osteoarthrosis, particularly medial compartment was  noted.  A Leksell rongeur utilized to remove osteophytes.  Remnants of medial and lateral menisci and ACL.  A step drill was utilized to enter femoral canal.  It was irrigated to 5 degree left with 9 off the distal femur was selected.  This was pinned  and distal femoral cut was then performed.  We then sized it to a 4 out the anterior cortex.  This was pinned in 3 degrees of external rotation.  A block was placed.  Anterior, posterior chamfer cuts were then performed.  Soft tissue  protected  posteriorly at all times with curved Mayos.  Next, subluxed the tibia.  Low side was posteromedially.  ____ that defect.  External alignment guide bisecting the tibiotalar joint parallel to the shaft, 3 degrees slope.  This was then pinned, which was  then 10 off the high side.  Oscillating saw performed this cut protecting the soft tissues.  The popliteus, patellar tendon, etc.  We used our extension gap.  Flexion and extension at 6 was satisfactory.  Subluxed the tibia, completed the tibia baseplate  for harvested bone.  Drilled it centrally utilizing our punch guide, and then performed our notch cut, bisecting the femur.  Trial femur placed.  Drilled our lug holes.  A 6 mm insert placed, and I reduced the knee.  I had full extension, full flexion,  good stability with varus valgus stressing at 0 to 30 degrees.  Negative anterior drawer.  Everted the patella.  Measured to a 22.  Planed 6.5 off of that.  The residual was 14.  Measured to a 35, drilled our peg holes medializing them and then placed a trial patella, reduced it and had excellent patellofemoral tracking.  All trials were then  removed.  Checked ____, cauterized geniculates.  Osteophytes were removed.  We copiously irrigated with pulsatile lavage.  Knee was flexed.  All surfaces thoroughly dried.  Mixed cement on the back table in appropriate fashion.  Injected cement into the  tibial canal, digitally pressurizing it.  I cemented the tibial tray with cement on the tray as well.  Impacted it.  Redundant cement removed.  I cement impacted the femur.  Redundant cement removed.  Insert placed, reduced it.  Held in axial load  throughout the curing of the cement.  Redundant cement removed.  I cemented and clamped the patella.  Marcaine 0.25% with epinephrine was infiltrated in the joint.  After appropriate curing of the cement, we let the tourniquet down at 57 minutes.  We  cauterized any bleeding, which was minimal.  I flexed the knee.   Redundant cement removed.  I felt this was an appropriate trial.  Selected the 6.  Removed the trial.  Meticulously removed all redundant cement.  We copiously irrigated with pulsatile  lavage antibiotic irrigation.  Subluxed the tibia, placed a 6 permanent insert, reduced it and had full flexion, full extension, good stability to varus valgus stress in 0 to 30 degrees.  Negative anterior drawer.  Next, in slight flexion we reapproximated the patellar arthrotomy with #1 Vicryl interrupted figure-of-eight sutures.  Oversewn with a running StrataFix, subcu with 2-0 and skin with subcuticular Monocryl.  Flexion gravity after closure and excellent  patellofemoral tracking.  A sterile dressing was applied, placed in immobilizer, extubated without difficulty and transported to the recovery room in satisfactory condition.  The patient tolerated the procedure well.  No complications.  Assistant Andrez Grime, [place].  Minimal blood loss.  LN/NUANCE  D:07/26/2018 T:07/26/2018 JOB:[id number]

## 2018-07-26 NOTE — Evaluation (Signed)
Physical Therapy Evaluation Patient Details Name: [name] MRN: [id number] DOB: 05-Oct-1950 Today's Date: 07/26/2018   History of Present Illness  68 yo female s/p L TKR on 07/26/18. PMH includes OA, HTN, RA, R TKR, bilateral RTC repair.  Clinical Impression  Pt presents with L knee pain, decreased L knee ROM, difficulty performing bed mobility, and syncopal-like symptoms upon sitting EOB. Pt to benefit from acute PT to address deficits. Pt tolerated sitting EOB ~46minutes, but unable to progress mobility due to s/s of nausea, dizziness, wooziness, and feeling hot. Pt educated on ankle pumps (20/hour) to perform this afternoon/evening to increase circulation, to pt's tolerance and limited by pain. PT to progress mobility as tolerated, and will continue to follow acutely.        Follow Up Recommendations Follow surgeon's recommendation for DC plan and follow-up therapies;Supervision for mobility/OOB    Equipment Recommendations  None recommended by PT    Recommendations for Other Services       Precautions / Restrictions Precautions Precautions: Fall Restrictions Weight Bearing Restrictions: No Other Position/Activity Restrictions: WBAT       Mobility  Bed Mobility Overal bed mobility: Needs Assistance Bed Mobility: Supine to Sit;Sit to Supine     Supine to sit: Min assist;HOB elevated Sit to supine: Min assist;HOB elevated;+2 for physical assistance   General bed mobility comments: Min assist for supine to sit for LLE management, scooting to EOB, and trunk elevation. Upon sitting EOB, pt reported feeling dizzy. After ~2 minutes sitting EOB, pt with complaints of wooziness, nausea, and feeling hot. PT and pt agreed for pt to lay back down due to symptoms. Pt with BP and HR of 125/65 and 85 bpm, respectively.    Transfers Overall transfer level: (NT - pt with s/s not conducive to further mobility)                  Ambulation/Gait                Stairs             Wheelchair Mobility    Modified Rankin (Stroke Patients Only)       Balance Overall balance assessment: Needs assistance Sitting-balance support: No upper extremity supported Sitting balance-Leahy Scale: Good         Standing balance comment: NT                              Pertinent Vitals/Pain Pain Assessment: 0-10 Pain Score: 5  Pain Location: L knee, with mobility Pain Descriptors / Indicators: Sore Pain Intervention(s): Limited activity within patient's tolerance;Repositioned;Ice applied;Monitored during session;Premedicated before session    Home Living Family/patient expects to be discharged to:: Private residence Living Arrangements: Spouse/significant other Available Help at Discharge: Family;Available 24 hours/day Type of Home: House Home Access: Ramped entrance     Home Layout: One level Home Equipment: Shower seat - built in;Toilet riser;Cane - single point;Walker - 2 wheels      Prior Function Level of Independence: Independent         Comments: Pt enjoys playing pickleball 5X/week     Hand Dominance   Dominant Hand: Right    Extremity/Trunk Assessment   Upper Extremity Assessment Upper Extremity Assessment: Overall WFL for tasks assessed    Lower Extremity Assessment Lower Extremity Assessment: Overall WFL for tasks assessed;LLE deficits/detail LLE Deficits / Details: suspected post-operative weakness; able to perform quad set, ankle pump, heel slide to  45*, difficulty with SLR and presented with >10* quad lag without lift assist LLE Sensation: WNL    Cervical / Trunk Assessment Cervical / Trunk Assessment: Normal  Communication   Communication: No difficulties  Cognition Arousal/Alertness: Awake/alert Behavior During Therapy: WFL for tasks assessed/performed Overall Cognitive Status: Within Functional Limits for tasks assessed                                        General Comments       Exercises Total Joint Exercises Goniometric ROM: pt AROM 5-60*, limited by pain    Assessment/Plan    PT Assessment Patient needs continued PT services  PT Problem List Decreased strength;Pain;Decreased range of motion;Decreased knowledge of use of DME;Decreased activity tolerance;Decreased balance;Decreased mobility       PT Treatment Interventions DME instruction;Therapeutic activities;Gait training;Therapeutic exercise;Patient/family education;Balance training;Functional mobility training    PT Goals (Current goals can be found in the Care Plan section)  Acute Rehab PT Goals Patient Stated Goal: get back to active lifestyle PT Goal Formulation: With patient Time For Goal Achievement: 08/02/18 Potential to Achieve Goals: Good    Frequency 7X/week   Barriers to discharge        Co-evaluation               AM-PAC PT "6 Clicks" Mobility  Outcome Measure Help needed turning from your back to your side while in a flat bed without using bedrails?: A Little Help needed moving from lying on your back to sitting on the side of a flat bed without using bedrails?: A Little Help needed moving to and from a bed to a chair (including a wheelchair)?: A Little Help needed standing up from a chair using your arms (e.g., wheelchair or bedside chair)?: A Little Help needed to walk in hospital room?: A Little Help needed climbing 3-5 steps with a railing? : A Lot 6 Click Score: 17    End of Session   Activity Tolerance: Patient limited by pain;Treatment limited secondary to medical complications (Comment) Patient left: in bed;with bed alarm set;with call bell/phone within reach;with SCD's reapplied Nurse Communication: Mobility status PT Visit Diagnosis: Other abnormalities of gait and mobility (R26.89);Muscle weakness (generalized) (M62.81)    Time: 9935-7017 PT Time Calculation (min) (ACUTE ONLY): 20 min   Charges:   PT Evaluation $PT Eval Low Complexity: 1 Low           Nicola Police, PT Acute Rehabilitation Services Pager [phone]  Office [phone]   Tyrone Apple D Despina Hidden 07/26/2018, 6:26 PM

## 2018-07-26 NOTE — Interval H&P Note (Signed)
History and Physical Interval Note:  07/26/2018 10:00 AM  [name]  has presented today for surgery, with the diagnosis of Left knee osteoarthritis  The various methods of treatment have been discussed with the patient and family. After consideration of risks, benefits and other options for treatment, the patient has consented to  Procedure(s) with comments: TOTAL KNEE ARTHROPLASTY (Left) - 2 hrs as a surgical intervention .  The patient's history has been reviewed, patient examined, no change in status, stable for surgery.  I have reviewed the patient's chart and labs.  Questions were answered to the patient's satisfaction.     Javier Docker

## 2018-07-26 NOTE — Anesthesia Preprocedure Evaluation (Signed)
Anesthesia Evaluation  Patient identified by MRN, date of birth, ID band Patient awake    Reviewed: Allergy & Precautions, H&P , NPO status , Patient's Chart, lab work & pertinent test results  History of Anesthesia Complications (+) PONV  Airway Mallampati: II  TM Distance: >3 FB Neck ROM: Full    Dental no notable dental hx. (+) Teeth Intact, Dental Advisory Given   Pulmonary asthma , former smoker,    Pulmonary exam normal breath sounds clear to auscultation       Cardiovascular hypertension, Pt. on medications negative cardio ROS Normal cardiovascular exam Rhythm:Regular Rate:Normal  07/22/2018 EKG NSR w RBBB R 69   Neuro/Psych  Headaches,    GI/Hepatic negative GI ROS, Neg liver ROS,   Endo/Other    Renal/GU K+ 3.4     Musculoskeletal  (+) Arthritis , RA   Abdominal   Peds  Hematology Hgb 14.3   Anesthesia Other Findings   Reproductive/Obstetrics                             Anesthesia Physical Anesthesia Plan  ASA: III  Anesthesia Plan: General   Post-op Pain Management:  Regional for Post-op pain   Induction: Intravenous  PONV Risk Score and Plan: 4 or greater and Dexamethasone, Ondansetron, Treatment may vary due to age or medical condition and Midazolam  Airway Management Planned: Oral ETT  Additional Equipment:   Intra-op Plan:   Post-operative Plan: Extubation in OR  Informed Consent: I have reviewed the patients History and Physical, chart, labs and discussed the procedure including the risks, benefits and alternatives for the proposed anesthesia with the patient or authorized representative who has indicated his/her understanding and acceptance.     Dental advisory given  Plan Discussed with: CRNA  Anesthesia Plan Comments: (LTKA w Adductor canal block)        Anesthesia Quick Evaluation

## 2018-07-26 NOTE — Discharge Instructions (Signed)
Elevate leg above heart 6x a day for 20minutes each °Use knee immobilizer while walking until can SLR x 10 °Use knee immobilizer in bed to keep knee in extension °Aquacel dressing may remain in place until follow up. May shower with aquacel dressing in place. If the dressing becomes saturated or peels off, you may remove aquacel dressing. Do not remove steri-strips if they are present. Place new dressing with gauze and tape or ACE bandage which should be kept clean and dry and changed daily. ° °INSTRUCTIONS AFTER JOINT REPLACEMENT  ° °o Remove items at home which could result in a fall. This includes throw rugs or furniture in walking pathways °o ICE to the affected joint every three hours while awake for 30 minutes at a time, for at least the first 3-5 days, and then as needed for pain and swelling.  Continue to use ice for pain and swelling. You may notice swelling that will progress down to the foot and ankle.  This is normal after surgery.  Elevate your leg when you are not up walking on it.   °o Continue to use the breathing machine you got in the hospital (incentive spirometer) which will help keep your temperature down.  It is common for your temperature to cycle up and down following surgery, especially at night when you are not up moving around and exerting yourself.  The breathing machine keeps your lungs expanded and your temperature down. ° ° °DIET:  As you were doing prior to hospitalization, we recommend a well-balanced diet. ° °DRESSING / WOUND CARE / SHOWERING ° °Keep the surgical dressing until follow up.  The dressing is water proof, so you can shower without any extra covering.  IF THE DRESSING FALLS OFF or the wound gets wet inside, change the dressing with sterile gauze.  Please use good hand washing techniques before changing the dressing.  Do not use any lotions or creams on the incision until instructed by your surgeon.   ° °ACTIVITY ° °o Increase activity slowly as tolerated, but follow the  weight bearing instructions below.   °o No driving for 6 weeks or until further direction given by your physician.  You cannot drive while taking narcotics.  °o No lifting or carrying greater than 10 lbs. until further directed by your surgeon. °o Avoid periods of inactivity such as sitting longer than an hour when not asleep. This helps prevent blood clots.  °o You may return to work once you are authorized by your doctor.  ° ° ° °WEIGHT BEARING  ° °Weight bearing as tolerated with assist device (walker, cane, etc) as directed, use it as long as suggested by your surgeon or therapist, typically at least 4-6 weeks. ° ° °EXERCISES ° °Results after joint replacement surgery are often greatly improved when you follow the exercise, range of motion and muscle strengthening exercises prescribed by your doctor. Safety measures are also important to protect the joint from further injury. Any time any of these exercises cause you to have increased pain or swelling, decrease what you are doing until you are comfortable again and then slowly increase them. If you have problems or questions, call your caregiver or physical therapist for advice.  ° °Rehabilitation is important following a joint replacement. After just a few days of immobilization, the muscles of the leg can become weakened and shrink (atrophy).  These exercises are designed to build up the tone and strength of the thigh and leg muscles and to improve motion. Often   times heat used for twenty to thirty minutes before working out will loosen up your tissues and help with improving the range of motion but do not use heat for the first two weeks following surgery (sometimes heat can increase post-operative swelling).  ° °These exercises can be done on a training (exercise) mat, on the floor, on a table or on a bed. Use whatever works the best and is most comfortable for you.    Use music or television while you are exercising so that the exercises are a pleasant  break in your day. This will make your life better with the exercises acting as a break in your routine that you can look forward to.   Perform all exercises about fifteen times, three times per day or as directed.  You should exercise both the operative leg and the other leg as well. ° °Exercises include: °  °• Quad Sets - Tighten up the muscle on the front of the thigh (Quad) and hold for 5-10 seconds.   °• Straight Leg Raises - With your knee straight (if you were given a brace, keep it on), lift the leg to 60 degrees, hold for 3 seconds, and slowly lower the leg.  Perform this exercise against resistance later as your leg gets stronger.  °• Leg Slides: Lying on your back, slowly slide your foot toward your buttocks, bending your knee up off the floor (only go as far as is comfortable). Then slowly slide your foot back down until your leg is flat on the floor again.  °• Angel Wings: Lying on your back spread your legs to the side as far apart as you can without causing discomfort.  °• Hamstring Strength:  Lying on your back, push your heel against the floor with your leg straight by tightening up the muscles of your buttocks.  Repeat, but this time bend your knee to a comfortable angle, and push your heel against the floor.  You may put a pillow under the heel to make it more comfortable if necessary.  ° °A rehabilitation program following joint replacement surgery can speed recovery and prevent re-injury in the future due to weakened muscles. Contact your doctor or a physical therapist for more information on knee rehabilitation.  ° ° °CONSTIPATION ° °Constipation is defined medically as fewer than three stools per week and severe constipation as less than one stool per week.  Even if you have a regular bowel pattern at home, your normal regimen is likely to be disrupted due to multiple reasons following surgery.  Combination of anesthesia, postoperative narcotics, change in appetite and fluid intake all can  affect your bowels.  ° °YOU MUST use at least one of the following options; they are listed in order of increasing strength to get the job done.  They are all available over the counter, and you may need to use some, POSSIBLY even all of these options:   ° °Drink plenty of fluids (prune juice may be helpful) and high fiber foods °Colace 100 mg by mouth twice a day  °Senokot for constipation as directed and as needed Dulcolax (bisacodyl), take with full glass of water  °Miralax (polyethylene glycol) once or twice a day as needed. ° °If you have tried all these things and are unable to have a bowel movement in the first 3-4 days after surgery call either your surgeon or your primary doctor.   ° °If you experience loose stools or diarrhea, hold the medications until you stool   forms back up.  If your symptoms do not get better within 1 week or if they get worse, check with your doctor.  If you experience "the worst abdominal pain ever" or develop nausea or vomiting, please contact the office immediately for further recommendations for treatment.   ITCHING:  If you experience itching with your medications, try taking only a single pain pill, or even half a pain pill at a time.  You can also use Benadryl over the counter for itching or also to help with sleep.   TED HOSE STOCKINGS:  Use stockings on both legs until for at least 2 weeks or as directed by physician office. They may be removed at night for sleeping.  MEDICATIONS:  See your medication summary on the After Visit Summary that nursing will review with you.  You may have some home medications which will be placed on hold until you complete the course of blood thinner medication.  It is important for you to complete the blood thinner medication as prescribed.  Information on my medicine - XARELTO (Rivaroxaban)  Why was Xarelto prescribed for you? Xarelto was prescribed for you to reduce the risk of blood clots forming after orthopedic surgery. The  medical term for these abnormal blood clots is venous thromboembolism (VTE).  What do you need to know about xarelto ? Take your Xarelto ONCE DAILY at the same time every day. You may take it either with or without food.  If you have difficulty swallowing the tablet whole, you may crush it and mix in applesauce just prior to taking your dose.  Take Xarelto exactly as prescribed by your doctor and DO NOT stop taking Xarelto without talking to the doctor who prescribed the medication.  Stopping without other VTE prevention medication to take the place of Xarelto may increase your risk of developing a clot.  After discharge, you should have regular check-up appointments with your healthcare provider that is prescribing your Xarelto.    What do you do if you miss a dose? If you miss a dose, take it as soon as you remember on the same day then continue your regularly scheduled once daily regimen the next day. Do not take two doses of Xarelto on the same day.   Important Safety Information A possible side effect of Xarelto is bleeding. You should call your healthcare provider right away if you experience any of the following: ? Bleeding from an injury or your nose that does not stop. ? Unusual colored urine (red or dark brown) or unusual colored stools (red or black). ? Unusual bruising for unknown reasons. ? A serious fall or if you hit your head (even if there is no bleeding).  Some medicines may interact with Xarelto and might increase your risk of bleeding while on Xarelto. To help avoid this, consult your healthcare provider or pharmacist prior to using any new prescription or non-prescription medications, including herbals, vitamins, non-steroidal anti-inflammatory drugs (NSAIDs) and supplements.  This website has more information on Xarelto: [email or web address].br.   PRECAUTIONS:  If you experience chest pain or shortness of breath - call 911 immediately for transfer to the hospital  emergency department.   If you develop a fever greater that 101 F, purulent drainage from wound, increased redness or drainage from wound, foul odor from the wound/dressing, or calf pain - CONTACT YOUR SURGEON.  FOLLOW-UP APPOINTMENTS:  If you do not already have a post-op appointment, please call the office for an appointment to be seen by your surgeon.  Guidelines for how soon to be seen are listed in your After Visit Summary, but are typically between 1-4 weeks after surgery.  OTHER INSTRUCTIONS:   Knee Replacement:  Do not place pillow under knee, focus on keeping the knee straight while resting. CPM instructions: 0-90 degrees, 2 hours in the morning, 2 hours in the afternoon, and 2 hours in the evening. Place foam block, curve side up under heel at all times except when in CPM or when walking.  DO NOT modify, tear, cut, or change the foam block in any way.  MAKE SURE YOU:   Understand these instructions.   Get help right away if you are not doing well or get worse.    Thank you for letting us be a part of your medical care team.  It is a privilege we respect greatly.  We hope these instructions will help you stay on track for a fast and full recovery!

## 2018-07-26 NOTE — Anesthesia Procedure Notes (Signed)
Anesthesia Regional Block: Adductor canal block   Pre-Anesthetic Checklist: ,, timeout performed, Correct Patient, Correct Site, Correct Laterality, Correct Procedure, Correct Position, site marked, Risks and benefits discussed,  Surgical consent,  Pre-op evaluation,  At surgeon's request and post-op pain management  Laterality: Lower and Left  Prep: chloraprep       Needles:  Injection technique: Single-shot  Needle Type: Echogenic Needle     Needle Length: 9cm  Needle Gauge: 22     Additional Needles:   Procedures:,,,, ultrasound used (permanent image in chart),,,,  Narrative:  Start time: 07/26/2018 8:40 AM End time: 07/26/2018 8:47 AM Injection made incrementally with aspirations every 5 mL.  Performed by: Personally  Anesthesiologist: Trevor Iha, MD  Additional Notes: Block assessed prior to surgery. Pt tolerated procedure well.

## 2018-07-26 NOTE — Anesthesia Procedure Notes (Addendum)
Procedure Name: Intubation Date/Time: 07/26/2018 10:51 AM Performed by: Eben Burow, CRNA Pre-anesthesia Checklist: Patient identified, Emergency Drugs available, Suction available, Patient being monitored and Timeout performed Patient Re-evaluated:Patient Re-evaluated prior to induction Oxygen Delivery Method: Circle system utilized Preoxygenation: Pre-oxygenation with 100% oxygen Induction Type: IV induction Ventilation: Mask ventilation without difficulty Laryngoscope Size: Mac and 4 Grade View: Grade I Tube type: Oral Tube size: 7.0 mm Number of attempts: 1 Airway Equipment and Method: Stylet Placement Confirmation: ETT inserted through vocal cords under direct vision,  positive ETCO2 and breath sounds checked- equal and bilateral Secured at: 21 cm Tube secured with: Tape Dental Injury: Teeth and Oropharynx as per pre-operative assessment

## 2018-07-26 NOTE — Transfer of Care (Signed)
Immediate Anesthesia Transfer of Care Note  Patient: [name]  Procedure(s) Performed: TOTAL KNEE ARTHROPLASTY (Left Knee)  Patient Location: PACU  Anesthesia Type:General  Level of Consciousness: awake, alert  and oriented  Airway & Oxygen Therapy: Patient Spontanous Breathing and Patient connected to face mask oxygen  Post-op Assessment: Report given to RN and Post -op Vital signs reviewed and stable  Post vital signs: Reviewed and stable  Last Vitals:  Vitals Value Taken Time  BP 122/70 07/26/2018  1:00 PM  Temp    Pulse 84 07/26/2018  1:00 PM  Resp 22 07/26/2018  1:00 PM  SpO2 100 % 07/26/2018  1:00 PM  Vitals shown include unvalidated device data.  Last Pain:  Vitals:   07/26/18 0848  TempSrc:   PainSc: 0-No pain      Patients Stated Pain Goal: 4 (07/26/18 3300)  Complications: No apparent anesthesia complications

## 2018-07-26 NOTE — Brief Op Note (Signed)
07/26/2018  12:30 PM  PATIENT:  [name]  68 y.o. female  PRE-OPERATIVE DIAGNOSIS:  Left knee osteoarthritis  POST-OPERATIVE DIAGNOSIS:  left knee osteoarthritis  PROCEDURE:  Procedure(s) with comments: TOTAL KNEE ARTHROPLASTY (Left) - 2 hrs  SURGEON:  Surgeon(s) and Role:    Jene Every, MD - Primary  PHYSICIAN ASSISTANT:   ASSISTANTS: Bissell   ANESTHESIA:   general  EBL:  20 mL   BLOOD ADMINISTERED:none  DRAINS: none   LOCAL MEDICATIONS USED:  MARCAINE     SPECIMEN:  No Specimen  DISPOSITION OF SPECIMEN:  N/A  COUNTS:  YES  TOURNIQUET:   Total Tourniquet Time Documented: Thigh (Left) - 57 minutes Total: Thigh (Left) - 57 minutes   DICTATION: .Other Dictation: Dictation Number [id number]  PLAN OF CARE: Admit to inpatient   PATIENT DISPOSITION:  PACU - hemodynamically stable.   Delay start of Pharmacological VTE agent (>24hrs) due to surgical blood loss or risk of bleeding: no

## 2018-07-26 NOTE — Anesthesia Postprocedure Evaluation (Signed)
Anesthesia Post Note  Patient: [name]  Procedure(s) Performed: TOTAL KNEE ARTHROPLASTY (Left Knee)     Patient location during evaluation: PACU Anesthesia Type: General Level of consciousness: awake and alert Pain management: pain level controlled Vital Signs Assessment: post-procedure vital signs reviewed and stable Respiratory status: spontaneous breathing, nonlabored ventilation, respiratory function stable and patient connected to nasal cannula oxygen Cardiovascular status: blood pressure returned to baseline and stable Postop Assessment: no apparent nausea or vomiting Anesthetic complications: no    Last Vitals:  Vitals:   07/26/18 1529 07/26/18 1628  BP: (!) 118/56 (!) 116/55  Pulse: 85 89  Resp: 18 16  Temp: 36.4 C (!) 36.4 C  SpO2: 99% 99%    Last Pain:  Vitals:   07/26/18 1628  TempSrc: Oral  PainSc:                  Trevor Iha

## 2018-07-27 ENCOUNTER — Encounter (HOSPITAL_COMMUNITY): Payer: Self-pay | Admitting: Specialist

## 2018-07-27 LAB — CBC
HCT: 35.8 % — ABNORMAL LOW (ref 36.0–46.0)
Hemoglobin: 12.1 g/dL (ref 12.0–15.0)
MCH: 32.8 pg (ref 26.0–34.0)
MCHC: 33.8 g/dL (ref 30.0–36.0)
MCV: 97 fL (ref 80.0–100.0)
Platelets: 236 10*3/uL (ref 150–400)
RBC: 3.69 MIL/uL — ABNORMAL LOW (ref 3.87–5.11)
RDW: 13.6 % (ref 11.5–15.5)
WBC: 12.1 10*3/uL — AB (ref 4.0–10.5)
nRBC: 0 % (ref 0.0–0.2)

## 2018-07-27 LAB — BASIC METABOLIC PANEL
Anion gap: 8 (ref 5–15)
BUN: 11 mg/dL (ref 8–23)
CO2: 24 mmol/L (ref 22–32)
Calcium: 8.5 mg/dL — ABNORMAL LOW (ref 8.9–10.3)
Chloride: 101 mmol/L (ref 98–111)
Creatinine, Ser: 0.74 mg/dL (ref 0.44–1.00)
GFR calc non Af Amer: >60 mL/min (ref >60)
Glucose, Bld: 133 mg/dL — ABNORMAL HIGH (ref 70–99)
POTASSIUM: 3.4 mmol/L — AB (ref 3.5–5.1)
Sodium: 133 mmol/L — ABNORMAL LOW (ref 135–145)

## 2018-07-27 NOTE — Progress Notes (Signed)
   07/27/18 1500  PT Visit Information  Last PT Received On 07/27/18---pt pain limiting activity, exercise focused session, also limited by pain  Assistance Needed +1  History of Present Illness 68 yo female s/p L TKR on 07/26/18. PMH includes OA, HTN, RA, R TKR, bilateral RTC repair.  Subjective Data  Patient Stated Goal get back to active lifestyle  Precautions  Precautions Fall;Knee  Required Braces or Orthoses Knee Immobilizer - Left  Restrictions  Weight Bearing Restrictions No  Other Position/Activity Restrictions WBAT   Pain Assessment  Pain Assessment 0-10  Pain Score 8  Pain Location L knee, with mobility  Pain Descriptors / Indicators Sore  Cognition  Arousal/Alertness Awake/alert  Behavior During Therapy WFL for tasks assessed/performed  Overall Cognitive Status Within Functional Limits for tasks assessed  Total Joint Exercises  Goniometric ROM grossly 8* to 50*, incr guarding  Ankle Circles/Pumps AROM;Both;15 reps  Quad Sets AROM;Both;10 reps  Heel Slides AROM;AAROM;Left;10 reps  Hip ABduction/ADduction AAROM;Left;10 reps  Straight Leg Raises AROM;AAROM;Left;10 reps  PT - End of Session  Equipment Utilized During Treatment Gait belt  Activity Tolerance Patient tolerated treatment well;Patient limited by pain  Patient left in chair;with call bell/phone within reach;with chair alarm set  Nurse Communication Mobility status   PT - Assessment/Plan  PT Plan Current plan remains appropriate  PT Visit Diagnosis Other abnormalities of gait and mobility (R26.89);Muscle weakness (generalized) (M62.81)  PT Frequency (ACUTE ONLY) 7X/week  Follow Up Recommendations Follow surgeon's recommendation for DC plan and follow-up therapies;Supervision for mobility/OOB  PT equipment None recommended by PT  AM-PAC PT "6 Clicks" Mobility Outcome Measure (Version 2)  Help needed turning from your back to your side while in a flat bed without using bedrails? 3  Help needed moving from  lying on your back to sitting on the side of a flat bed without using bedrails? 3  Help needed moving to and from a bed to a chair (including a wheelchair)? 3  Help needed standing up from a chair using your arms (e.g., wheelchair or bedside chair)? 3  Help needed to walk in hospital room? 3  Help needed climbing 3-5 steps with a railing?  2  6 Click Score 17  Consider Recommendation of Discharge To: Home with [facility]  PT Goal Progression  Progress towards PT goals Progressing toward goals  Acute Rehab PT Goals  PT Goal Formulation With patient  Time For Goal Achievement 08/02/18  Potential to Achieve Goals Good  PT Time Calculation  PT Start Time (ACUTE ONLY) 1455  PT Stop Time (ACUTE ONLY) 1515  PT Time Calculation (min) (ACUTE ONLY) 20 min  PT General Charges  $$ ACUTE PT VISIT 1 Visit  PT Treatments  $Therapeutic Exercise 8-22 mins

## 2018-07-27 NOTE — Progress Notes (Signed)
Physical Therapy Treatment Patient Details Name: [name] M Cwik MRN: [id number] DOB: [birth year] Today's Date: 07/27/2018    History of Present Illness 68 yo female s/p L TKR on 07/26/18. PMH includes OA, HTN, RA, R TKR, bilateral RTC repair.    PT Comments    Progressing although limited by pain, pt states Dr. Shelle IronBeane said she will likely be here until Saturday  Follow Up Recommendations  Follow surgeon's recommendation for DC plan and follow-up therapies;Supervision for mobility/OOB     Equipment Recommendations  None recommended by PT    Recommendations for Other Services       Precautions / Restrictions Precautions Precautions: Fall;Knee Required Braces or Orthoses: Knee Immobilizer - Left Restrictions Weight Bearing Restrictions: No Other Position/Activity Restrictions: WBAT     Mobility  Bed Mobility Overal bed mobility: Needs Assistance Bed Mobility: Supine to Sit     Supine to sit: Min assist     General bed mobility comments: assist with LLE   Transfers Overall transfer level: Needs assistance   Transfers: Sit to/from Stand Sit to Stand: Min assist;Min guard         General transfer comment: cues for hand placement and LLE position  Ambulation/Gait Ambulation/Gait assistance: Min guard;Min assist Gait Distance (Feet): 12 Feet(x2) Assistive device: Rolling walker (2 wheeled) Gait Pattern/deviations: Step-to pattern;Antalgic     General Gait Details: cues for sequence and RW safety, distance limited by pain   Stairs             Wheelchair Mobility    Modified Rankin (Stroke Patients Only)       Balance                                            Cognition Arousal/Alertness: Awake/alert Behavior During Therapy: WFL for tasks assessed/performed Overall Cognitive Status: Within Functional Limits for tasks assessed                                        Exercises Total Joint Exercises Ankle  Circles/Pumps: AROM;Both;15 reps Quad Sets: AROM;Both;10 reps    General Comments        Pertinent Vitals/Pain Pain Assessment: 0-10 Pain Score: 5  Pain Location: L knee, with mobility Pain Descriptors / Indicators: Sore Pain Intervention(s): Limited activity within patient's tolerance;Monitored during session;Premedicated before session;Repositioned;Ice applied    Home Living                      Prior Function            PT Goals (current goals can now be found in the care plan section) Acute Rehab PT Goals Patient Stated Goal: get back to active lifestyle PT Goal Formulation: With patient Time For Goal Achievement: 08/02/18 Potential to Achieve Goals: Good Progress towards PT goals: Progressing toward goals    Frequency    7X/week      PT Plan Current plan remains appropriate    Co-evaluation              AM-PAC PT "6 Clicks" Mobility   Outcome Measure  Help needed turning from your back to your side while in a flat bed without using bedrails?: A Little Help needed moving from lying on your back to sitting on the side of a  flat bed without using bedrails?: A Little Help needed moving to and from a bed to a chair (including a wheelchair)?: A Little Help needed standing up from a chair using your arms (e.g., wheelchair or bedside chair)?: A Little Help needed to walk in hospital room?: A Little Help needed climbing 3-5 steps with a railing? : A Lot 6 Click Score: 17    End of Session Equipment Utilized During Treatment: Gait belt Activity Tolerance: Patient tolerated treatment well;Patient limited by pain Patient left: in chair;with call bell/phone within reach;with chair alarm set Nurse Communication: Mobility status PT Visit Diagnosis: Other abnormalities of gait and mobility (R26.89);Muscle weakness (generalized) (M62.81)     Time: 7793-9030 PT Time Calculation (min) (ACUTE ONLY): 24 min  Charges:  $Gait Training: 23-37 mins                      Drucilla Chalet, PT  Pager: [phone] Acute Rehab Dept [facility]): [phone]   07/27/2018    [facility] 07/27/2018, 11:20 AM

## 2018-07-27 NOTE — Progress Notes (Signed)
CSW consult- SNF Plan: Outpatient Therapy  Vivi Barrack, [place], MSW Clinical Social Worker  [phone] 07/27/2018  9:22 AM

## 2018-07-27 NOTE — Discharge Summary (Signed)
Physician Discharge Summary   Patient ID: [name] MRN: [id number] DOB/AGE: [birth year] 69 y.o.  Admit date: 07/26/2018 Discharge date: 07/31/2018  Primary Diagnosis: left knee primary osteoarthritis  Admission Diagnoses:  Past Medical History:  Diagnosis Date  . Acute meniscal tear of left knee   . Arthritis   . Asthma due to environmental allergies   . Complication of anesthesia   . Degenerative disc disease, lumbar   . Diverticulosis of sigmoid colon   . Fibromyalgia   . Headache    Migraines  . History of gastric ulcer   . History of kidney stones   . Hypertension   . Measles   . Mumps   . PONV (postoperative nausea and vomiting) 1980's  . RA (rheumatoid arthritis) ([place])   . Shingles 09/2017  . Swelling of right knee joint    S/P   ARTHROSCOPY -- 09-07-2013  . Varicose veins of both lower extremities   . Wears glasses    Discharge Diagnoses:   Principal Problem:   Primary osteoarthritis of left knee Active Problems:   Left knee DJD  Estimated body mass index is 27.77 kg/m as calculated from the following:   Height as of this encounter: 5' 0.25" (1.53 m).   Weight as of this encounter: 65 kg.  Procedure:  Procedure(s) (LRB): TOTAL KNEE ARTHROPLASTY (Left)   Consults: None  HPI: see H&P Laboratory Data: Admission on 07/26/2018  Component Date Value Ref Range Status  . WBC 07/27/2018 12.1* 4.0 - 10.5 K/uL Final  . RBC 07/27/2018 3.69* 3.87 - 5.11 MIL/uL Final  . Hemoglobin 07/27/2018 12.1  12.0 - 15.0 g/dL Final  . HCT 07/27/2018 35.8* 36.0 - 46.0 % Final  . MCV 07/27/2018 97.0  80.0 - 100.0 fL Final  . MCH 07/27/2018 32.8  26.0 - 34.0 pg Final  . MCHC 07/27/2018 33.8  30.0 - 36.0 g/dL Final  . RDW 07/27/2018 13.6  11.5 - 15.5 % Final  . Platelets 07/27/2018 236  150 - 400 K/uL Final  . nRBC 07/27/2018 0.0  0.0 - 0.2 % Final   Performed at [facility], [place] [address], [place], [place] [address]  . Sodium 07/27/2018 133* 135 -  145 mmol/L Final  . Potassium 07/27/2018 3.4* 3.5 - 5.1 mmol/L Final  . Chloride 07/27/2018 101  98 - 111 mmol/L Final  . CO2 07/27/2018 24  22 - 32 mmol/L Final  . Glucose, Bld 07/27/2018 133* 70 - 99 mg/dL Final  . BUN 07/27/2018 11  8 - 23 mg/dL Final  . Creatinine, Ser 07/27/2018 0.74  0.44 - 1.00 mg/dL Final  . Calcium 07/27/2018 8.5* 8.9 - 10.3 mg/dL Final  . GFR calc non Af Amer 07/27/2018 >60  >60 mL/min Final  . GFR calc Af Amer 07/27/2018 >60  >60 mL/min Final  . Anion gap 07/27/2018 8  5 - 15 Final   Performed at [facility], [place] [address], [place], [place] [address]  Hospital Outpatient Visit on 07/19/2018  Component Date Value Ref Range Status  . aPTT 07/19/2018 51* 24 - 36 seconds Final   Comment:        IF BASELINE aPTT IS ELEVATED, SUGGEST PATIENT RISK ASSESSMENT BE USED TO DETERMINE APPROPRIATE ANTICOAGULANT THERAPY. Performed at [facility], [place] [address], [place], [place] [address]   . Sodium 07/19/2018 138  135 - 145 mmol/L Final  . Potassium 07/19/2018 3.4* 3.5 - 5.1 mmol/L Final  . Chloride 07/19/2018 99  98 - 111 mmol/L Final  . CO2 07/19/2018 28  22 - 32 mmol/L Final  . Glucose, Bld 07/19/2018 92  70 - 99 mg/dL Final  . BUN 07/19/2018 14  8 - 23 mg/dL Final  . Creatinine, Ser 07/19/2018 0.96  0.44 - 1.00 mg/dL Final  . Calcium 07/19/2018 9.5  8.9 - 10.3 mg/dL Final  . GFR calc non Af Amer 07/19/2018 >60  >60 mL/min Final  . GFR calc Af Amer 07/19/2018 >60  >60 mL/min Final  . Anion gap 07/19/2018 11  5 - 15 Final   Performed at [facility], [place] [address], [place], [place] [address]  . WBC 07/19/2018 6.2  4.0 - 10.5 K/uL Final  . RBC 07/19/2018 4.40  3.87 - 5.11 MIL/uL Final  . Hemoglobin 07/19/2018 14.3  12.0 - 15.0 g/dL Final  . HCT 07/19/2018 42.9  36.0 - 46.0 % Final  . MCV 07/19/2018 97.5  80.0 - 100.0 fL Final  . MCH 07/19/2018 32.5  26.0 - 34.0 pg Final  . MCHC 07/19/2018 33.3  30.0 -  36.0 g/dL Final  . RDW 07/19/2018 13.7  11.5 - 15.5 % Final  . Platelets 07/19/2018 262  150 - 400 K/uL Final  . nRBC 07/19/2018 0.0  0.0 - 0.2 % Final   Performed at [facility], [place] [address] [place], [place] [address]  . Prothrombin Time 07/19/2018 12.5  11.4 - 15.2 seconds Final  . INR 07/19/2018 0.94   Final   Performed at [facility], [place] [address], [place], [place] [address]  . Color, Urine 07/19/2018 YELLOW  YELLOW Final  . APPearance 07/19/2018 CLEAR  CLEAR Final  . Specific Gravity, Urine 07/19/2018 1.017  1.005 - 1.030 Final  . pH 07/19/2018 8.0  5.0 - 8.0 Final  . Glucose, UA 07/19/2018 NEGATIVE  NEGATIVE mg/dL Final  . Hgb urine dipstick 07/19/2018 NEGATIVE  NEGATIVE Final  . Bilirubin Urine 07/19/2018 NEGATIVE  NEGATIVE Final  . Ketones, ur 07/19/2018 NEGATIVE  NEGATIVE mg/dL Final  . Protein, ur 07/19/2018 NEGATIVE  NEGATIVE mg/dL Final  . Nitrite 07/19/2018 NEGATIVE  NEGATIVE Final  . Leukocytes, UA 07/19/2018 NEGATIVE  NEGATIVE Final   Performed at [facility], [place] [address], [place], [place] [address]  . MRSA, PCR 07/19/2018 NEGATIVE  NEGATIVE Final  . Staphylococcus aureus 07/19/2018 POSITIVE* NEGATIVE Final   Comment: (NOTE) The Xpert SA Assay (FDA approved for NASAL specimens in patients 38 years of age and older), is one component of a comprehensive surveillance program. It is not intended to diagnose infection nor to guide or monitor treatment. Performed at [facility], [place] [address], [place], [place] [address]      X-Rays:Dg Knee Left Port  Result Date: 07/26/2018 CLINICAL DATA:  Total knee replacement. EXAM: PORTABLE LEFT KNEE - 1-2 VIEW COMPARISON:  Left knee x-rays dated February 11, 2009. FINDINGS: The left knee demonstrates a total knee arthroplasty without evidence of hardware failure or complication. There is expected intra-articular air. There is no fracture or  dislocation. The alignment is anatomic. Post-surgical changes noted in the surrounding soft tissues. IMPRESSION: 1. Interval left total knee arthroplasty without evidence of acute postoperative complication. Electronically Signed   By: Titus Dubin M.D.   On: 07/26/2018 13:59    EKG: Orders placed or performed during the hospital encounter of 07/19/18  . EKG 12 lead  . EKG 12 lead     Hospital Course: Hailey Seelye  Wells is a 68 y.o. who was admitted to [facility]. They were brought to the operating room on 07/26/2018 and underwent Procedure(s): TOTAL KNEE ARTHROPLASTY.  Patient tolerated the procedure well and was later transferred to the recovery room and then to the orthopaedic floor for postoperative care.  They were given PO and IV analgesics for pain control following their surgery.  They were given 24 hours of postoperative antibiotics of  Anti-infectives (From admission, onward)   Start     Dose/Rate Route Frequency Ordered Stop   07/27/18 1000  hydroxychloroquine (PLAQUENIL) tablet 200 mg     200 mg Oral Daily 07/26/18 1431     07/26/18 1700  ceFAZolin (ANCEF) IVPB 2g/100 mL premix     2 g 200 mL/hr over 30 Minutes Intravenous Every 6 hours 07/26/18 1431 07/27/18 0017   07/26/18 1123  polymyxin B 500,000 Units, bacitracin 50,000 Units in sodium chloride 0.9 % 500 mL irrigation  Status:  Discontinued       As needed 07/26/18 1124 07/26/18 1254   07/26/18 0800  ceFAZolin (ANCEF) IVPB 2g/100 mL premix     2 g 200 mL/hr over 30 Minutes Intravenous On call to O.R. 07/26/18 1287 07/26/18 1107     and started on DVT prophylaxis in the form of Xarelto, TED hose and SCDs.   PT and OT were ordered for total joint protocol.  Discharge planning consulted to help with postop disposition and equipment needs.  Patient had a good night on the evening of surgery.  They started to get up OOB with therapy on day one.  Continued to work with therapy into day two.  Her progress was delayed due to  pain and swelling.  By day five, the patient had progressed with therapy and meeting their goals.  Incision was healing well.  Patient was seen in rounds and was ready to go home.   Diet: Regular diet Activity:WBAT Follow-up: 10-14 days post-op Disposition - Home with outpatient PT Discharged Condition: good    Allergies as of 07/27/2018      Reactions   Asa [aspirin] Other (See Comments)   Avoids asa 380m, causes right side facial numbness and gi upset/   Pt can take asa 837mwith no issues   Iohexol Hives   Patient needs 13 hour prep before ct scans with contrast   Methadone Other (See Comments)   unknown   Oxycodone Other (See Comments)   Caused sores/lesions in nose   Sulfa Antibiotics Hives   Zostavax [zoster Vaccine Live] Other (See Comments)   Made pt feel sick with flu   Pregabalin Other (See Comments)   Blisters in throat      Medication List    TAKE these medications   acetaminophen 500 MG tablet Commonly known as:  TYLENOL Take 500 mg by mouth every 6 (six) hours as needed for mild pain.   amLODipine 5 MG tablet Commonly known as:  NORVASC Take 5 mg by mouth daily.   Certolizumab Pegol 2 X 200 MG Kit Inject 400 mg into the skin every 30 (thirty) days.   cycloSPORINE 0.05 % ophthalmic emulsion Commonly known as:  RESTASIS Place 1 drop into both eyes 2 (two) times daily.   docusate sodium 100 MG capsule Commonly known as:  COLACE Take 1 capsule (100 mg total) by mouth 2 (two) times daily.   HYDROmorphone 2 MG tablet Commonly known as:  DILAUDID Take 1 tablet (2 mg total) by mouth every 4 (four) hours  as needed for severe pain.   hydroxychloroquine 200 MG tablet Commonly known as:  PLAQUENIL Take 200 mg by mouth daily.   indapamide 1.25 MG tablet Commonly known as:  LOZOL Take 1.25 mg by mouth daily.   Magnesium 500 MG Caps Take 1,000 mg by mouth at bedtime.   montelukast 10 MG tablet Commonly known as:  SINGULAIR Take 10 mg by mouth at  bedtime as needed (allergies).   polyethylene glycol packet Commonly known as:  MIRALAX / GLYCOLAX Take 17 g by mouth daily.   potassium chloride 10 MEQ tablet Commonly known as:  K-DUR Take 10 mEq by mouth 3 (three) times daily.   PROAIR HFA 108 (90 Base) MCG/ACT inhaler Generic drug:  albuterol Inhale 2 puffs into the lungs every 6 (six) hours as needed for wheezing or shortness of breath.   ranitidine 150 MG capsule Commonly known as:  ZANTAC Take 150 mg by mouth 2 (two) times daily as needed for heartburn.   rivaroxaban 10 MG Tabs tablet Commonly known as:  XARELTO Take 1 tablet (10 mg total) by mouth daily.   traMADol 50 MG tablet Commonly known as:  ULTRAM Take 50 mg by mouth every 6 (six) hours as needed (pain).   Vitamin D 50 MCG (2000 UT) tablet Take 2,000 Units by mouth daily.   zolpidem 10 MG tablet Commonly known as:  AMBIEN Take 5 mg by mouth at bedtime.      Follow-up Information    Susa Day, MD Follow up in 2 week(s).   Specialty:  Orthopedic Surgery Contact information: [address] [place] [address] [phone]           Signed: Lacie Draft, PA-C Orthopaedic Surgery 07/27/2018, 1:59 PM

## 2018-07-27 NOTE — Progress Notes (Signed)
Subjective: 1 Day Post-Op Procedure(s) (LRB): TOTAL KNEE ARTHROPLASTY (Left) Patient reports pain as 3 on 0-10 scale.    Objective: Vital signs in last 24 hours: Temp:  [97.4 F (36.3 C)-98.2 F (36.8 C)] 98 F (36.7 C) (01/30 0531) Pulse Rate:  [63-89] 73 (01/30 0531) Resp:  [12-22] 16 (01/30 0531) BP: (107-149)/(49-70) 107/55 (01/30 0531) SpO2:  [97 %-100 %] 99 % (01/30 0531) Weight:  [65 kg] 65 kg (01/29 0814)  Intake/Output from previous day: 01/29 0701 - 01/30 0700 In: 3932 [P.O.:720; I.V.:2762; IV Piggyback:450] Out: 3045 [Urine:3025; Blood:20] Intake/Output this shift: No intake/output data recorded.  Recent Labs    07/27/18 0535  HGB 12.1   Recent Labs    07/27/18 0535  WBC 12.1*  RBC 3.69*  HCT 35.8*  PLT 236   Recent Labs    07/27/18 0535  NA 133*  K 3.4*  CL 101  CO2 24  BUN 11  CREATININE 0.74  GLUCOSE 133*  CALCIUM 8.5*   No results for input(s): LABPT, INR in the last 72 hours.  Neurologically intact ABD soft Neurovascular intact Intact pulses distally Dorsiflexion/Plantar flexion intact Incision: dressing C/D/I Compartment soft NoDVT  Anticipated LOS equal to or greater than 2 midnights due to - Age 37 and older with one or more of the following:  - Obesity  - Expected need for hospital services (PT, OT, Nursing) required for safe  discharge  - Anticipated need for postoperative skilled nursing care or inpatient rehab  - Active co-morbidities: None OR   - Unanticipated findings during/Post Surgery: None  - Patient is a high risk of re-admission due to: None   Assessment/Plan: 1 Day Post-Op Procedure(s) (LRB): TOTAL KNEE ARTHROPLASTY (Left) Advance diet Up with therapy D/C IV fluids Plan for discharge tomorrow    Javier Docker 07/27/2018, 7:21 AM

## 2018-07-27 NOTE — Addendum Note (Signed)
Addendum  created 07/27/18 0740 by Elyn PeersAllen, Reality Dejonge J, CRNA   Charge Capture section accepted

## 2018-07-28 LAB — CBC
HCT: 36.9 % (ref 36.0–46.0)
Hemoglobin: 12.2 g/dL (ref 12.0–15.0)
MCH: 32.4 pg (ref 26.0–34.0)
MCHC: 33.1 g/dL (ref 30.0–36.0)
MCV: 97.9 fL (ref 80.0–100.0)
NRBC: 0 % (ref 0.0–0.2)
Platelets: 231 10*3/uL (ref 150–400)
RBC: 3.77 MIL/uL — AB (ref 3.87–5.11)
RDW: 14 % (ref 11.5–15.5)
WBC: 9.3 10*3/uL (ref 4.0–10.5)

## 2018-07-28 NOTE — Progress Notes (Signed)
Physical Therapy Treatment Patient Details Name: [name] MRN: [id number] DOB: [birth year] Today's Date: 07/28/2018    History of Present Illness 68 yo female s/p L TKR on 07/26/18. PMH includes OA, HTN, RA, R TKR, bilateral RTC repair.    PT Comments    Pt progressing slowly.  Pain issues continue. When pain improves, mobility will likely improve drastically  as well. Pt is quite active and independent at baseline  Follow Up Recommendations  Follow surgeon's recommendation for DC plan and follow-up therapies;Supervision for mobility/OOB     Equipment Recommendations  None recommended by PT    Recommendations for Other Services       Precautions / Restrictions Precautions Precautions: Fall;Knee Required Braces or Orthoses: Knee Immobilizer - Left Restrictions Weight Bearing Restrictions: No Other Position/Activity Restrictions: WBAT     Mobility  Bed Mobility                  Transfers                    Ambulation/Gait                 Stairs             Wheelchair Mobility    Modified Rankin (Stroke Patients Only)       Balance                                            Cognition Arousal/Alertness: Awake/alert Behavior During Therapy: WFL for tasks assessed/performed Overall Cognitive Status: Within Functional Limits for tasks assessed                                        Exercises Total Joint Exercises Ankle Circles/Pumps: AROM;Both;15 reps Quad Sets: AROM;Both;10 reps Heel Slides: AROM;AAROM;Left;10 reps Hip ABduction/ADduction: AAROM;Left;10 reps Straight Leg Raises: AROM;AAROM;Left;10 reps Goniometric ROM: grossly 12 to 55*--with incr time and lots of assist from PT Other Exercises Other Exercises: prolonged passive L knee flexion stretch (~ 45-50* for 4 mins)    General Comments        Pertinent Vitals/Pain Pain Assessment: 0-10 Pain Score: 8  Pain Location: L knee,  with mobility Pain Descriptors / Indicators: Sore Pain Intervention(s): Limited activity within patient's tolerance;Monitored during session;Premedicated before session;Repositioned;Ice applied    Home Living                      Prior Function            PT Goals (current goals can now be found in the care plan section) Acute Rehab PT Goals Patient Stated Goal: get back to active lifestyle PT Goal Formulation: With patient Time For Goal Achievement: 08/02/18 Potential to Achieve Goals: Good Progress towards PT goals: Progressing toward goals    Frequency    7X/week      PT Plan Current plan remains appropriate    Co-evaluation              AM-PAC PT "6 Clicks" Mobility   Outcome Measure  Help needed turning from your back to your side while in a flat bed without using bedrails?: A Little Help needed moving from lying on your back to sitting on the side of a flat bed without using  bedrails?: A Little Help needed moving to and from a bed to a chair (including a wheelchair)?: A Little Help needed standing up from a chair using your arms (e.g., wheelchair or bedside chair)?: A Little Help needed to walk in hospital room?: A Little Help needed climbing 3-5 steps with a railing? : A Lot 6 Click Score: 17    End of Session Equipment Utilized During Treatment: Gait belt Activity Tolerance: Patient tolerated treatment well;Patient limited by pain Patient left: in chair;with call bell/phone within reach;with chair alarm set Nurse Communication: Mobility status PT Visit Diagnosis: Other abnormalities of gait and mobility (R26.89);Muscle weakness (generalized) (M62.81)     Time: 1329-     Charges:  $Therapeutic Exercise: 8-22 mins                     Drucilla Chaletara Kaydence Menard, PT  Pager: [phone] Acute Rehab Dept [facility]): [phone]   07/28/2018    [facility] LPWILLIAMS,Hoby Kawai 07/28/2018, 3:45 PM

## 2018-07-28 NOTE — Progress Notes (Signed)
3Subjective: 2 Days Post-Op Procedure(s) (LRB): TOTAL KNEE ARTHROPLASTY (Left) Patient reports pain as 4 on 0-10 scale.    Objective: Vital signs in last 24 hours: Temp:  [97.9 F (36.6 C)-98.8 F (37.1 C)] 98.8 F (37.1 C) (01/31 0516) Pulse Rate:  [68-90] 90 (01/31 0516) Resp:  [15-18] 18 (01/31 0516) BP: (116-157)/(52-73) 152/73 (01/31 0516) SpO2:  [98 %-100 %] 98 % (01/31 0516)  Intake/Output from previous day: 01/30 0701 - 01/31 0700 In: 420 [P.O.:420] Out: 2300 [Urine:2300] Intake/Output this shift: No intake/output data recorded.  Recent Labs    07/27/18 0535 07/28/18 0514  HGB 12.1 12.2   Recent Labs    07/27/18 0535 07/28/18 0514  WBC 12.1* 9.3  RBC 3.69* 3.77*  HCT 35.8* 36.9  PLT 236 231   Recent Labs    07/27/18 0535  NA 133*  K 3.4*  CL 101  CO2 24  BUN 11  CREATININE 0.74  GLUCOSE 133*  CALCIUM 8.5*   No results for input(s): LABPT, INR in the last 72 hours.  Neurologically intact Neurovascular intact Intact pulses distally Dorsiflexion/Plantar flexion intact Incision: dressing C/D/I  No dvt  Assessment/Plan: 2 Days Post-Op Procedure(s) (LRB): TOTAL KNEE ARTHROPLASTY (Left) Advance diet Up with therapy Discharge home with home health    Javier Docker 07/28/2018, 7:25 AM

## 2018-07-28 NOTE — Progress Notes (Signed)
Physical Therapy Treatment Patient Details Name: [name] MRN: [id number] DOB: [birth year] Today's Date: 07/28/2018    History of Present Illness 68 yo female s/p L TKR on 07/26/18. PMH includes OA, HTN, RA, R TKR, bilateral RTC repair.    PT Comments    Pt unable to amb >5' d/t pain, unable to perform exercises secondary to pain; RN aware  Follow Up Recommendations  Follow surgeon's recommendation for DC plan and follow-up therapies;Supervision for mobility/OOB     Equipment Recommendations  None recommended by PT    Recommendations for Other Services       Precautions / Restrictions Precautions Precautions: Fall;Knee Required Braces or Orthoses: Knee Immobilizer - Left Restrictions Weight Bearing Restrictions: No Other Position/Activity Restrictions: WBAT     Mobility  Bed Mobility Overal bed mobility: Needs Assistance Bed Mobility: Supine to Sit     Supine to sit: Min assist     General bed mobility comments: assist with LLE   Transfers Overall transfer level: Needs assistance Equipment used: Rolling walker (2 wheeled) Transfers: Sit to/from Stand Sit to Stand: Min guard;Min assist         General transfer comment: cues for hand placement and LLE position  Ambulation/Gait Ambulation/Gait assistance: Min guard;Min assist Gait Distance (Feet): 5 Feet Assistive device: Rolling walker (2 wheeled) Gait Pattern/deviations: Step-to pattern;Antalgic     General Gait Details: cues for sequence and RW safety, distance limited by pain   Stairs             Wheelchair Mobility    Modified Rankin (Stroke Patients Only)       Balance                                            Cognition Arousal/Alertness: Awake/alert Behavior During Therapy: WFL for tasks assessed/performed Overall Cognitive Status: Within Functional Limits for tasks assessed                                        Exercises Total Joint  Exercises Ankle Circles/Pumps: AROM;Both;15 reps Quad Sets: Both;5 reps;Limitations Quad Sets Limitations: unable secondary to pain    General Comments        Pertinent Vitals/Pain Pain Assessment: 0-10 Pain Score: 6  Pain Location: L knee, with mobility Pain Descriptors / Indicators: Sore Pain Intervention(s): Limited activity within patient's tolerance;Monitored during session;Repositioned;Premedicated before session;Ice applied    Home Living                      Prior Function            PT Goals (current goals can now be found in the care plan section) Acute Rehab PT Goals PT Goal Formulation: With patient Time For Goal Achievement: 08/02/18 Potential to Achieve Goals: Good Progress towards PT goals: Progressing toward goals    Frequency    7X/week      PT Plan Current plan remains appropriate    Co-evaluation              AM-PAC PT "6 Clicks" Mobility   Outcome Measure  Help needed turning from your back to your side while in a flat bed without using bedrails?: A Little Help needed moving from lying on your back to sitting on the side  of a flat bed without using bedrails?: A Little Help needed moving to and from a bed to a chair (including a wheelchair)?: A Little Help needed standing up from a chair using your arms (e.g., wheelchair or bedside chair)?: A Little Help needed to walk in hospital room?: A Little Help needed climbing 3-5 steps with a railing? : A Lot 6 Click Score: 17    End of Session Equipment Utilized During Treatment: Gait belt Activity Tolerance: Patient limited by pain;No increased pain Patient left: in chair;with call bell/phone within reach;with family/visitor present Nurse Communication: Mobility status PT Visit Diagnosis: Other abnormalities of gait and mobility (R26.89);Muscle weakness (generalized) (M62.81)     Time: [id number] PT Time Calculation (min) (ACUTE ONLY): 16 min  Charges:  $Gait Training: 8-22  mins                     Drucilla Chaletara Jayme Mednick, PT  Pager: [phone] Acute Rehab Dept [facility]): [phone]   07/28/2018    [facility] Healthcare[facility]ILLIAMS,Hailey Koman 07/28/2018, 11:37 AM

## 2018-07-28 NOTE — Care Management Important Message (Signed)
Important Message  Patient Details  Name: IVORY MAHOWALD MRN: [id number] Date of Birth: 02-Aug-1950   Medicare Important Message Given:  Yes    Caren Macadam 07/28/2018, 10:24 AMImportant Message  Patient Details  Name: JUNIPER GUITREAU MRN: [id number] Date of Birth: 06-Dec-1950   Medicare Important Message Given:  Yes    Caren Macadam 07/28/2018, 10:24 AM

## 2018-07-29 LAB — CBC
HCT: 35.9 % — ABNORMAL LOW (ref 36.0–46.0)
Hemoglobin: 12 g/dL (ref 12.0–15.0)
MCH: 32.5 pg (ref 26.0–34.0)
MCHC: 33.4 g/dL (ref 30.0–36.0)
MCV: 97.3 fL (ref 80.0–100.0)
Platelets: 222 K/uL (ref 150–400)
RBC: 3.69 MIL/uL — ABNORMAL LOW (ref 3.87–5.11)
RDW: 13.7 % (ref 11.5–15.5)
WBC: 10.6 K/uL — ABNORMAL HIGH (ref 4.0–10.5)
nRBC: 0 % (ref 0.0–0.2)

## 2018-07-29 NOTE — Progress Notes (Signed)
Physical Therapy Treatment Patient Details Name: [name] MRN: [id number] DOB: 1950/10/21 Today's Date: 07/29/2018    History of Present Illness 68 yo female s/p L TKR on 07/26/18. PMH includes OA, HTN, RA, R TKR, bilateral RTC repair.    PT Comments    Pt still very limited with mobility due to pain and limited with knee flexion exercises and stretches due to pain. She did feel she tolerated a little better this session. Walked with RW to the bathroom and then made it into the hallway. Slow step to gait, pain especially even with light touch anywhere around that knee. Quad contraction still very slow to activated and limited with activation. Will continue to follow.      Follow Up Recommendations  Follow surgeon's recommendation for DC plan and follow-up therapies;Supervision for mobility/OOB     Equipment Recommendations  None recommended by PT    Recommendations for Other Services       Precautions / Restrictions Precautions Precautions: Fall;Knee Required Braces or Orthoses: Knee Immobilizer - Left Knee Immobilizer - Left: Discontinue once straight leg raise with < 10 degree lag Restrictions Weight Bearing Restrictions: No Other Position/Activity Restrictions: WBAT     Mobility  Bed Mobility Overal bed mobility: Needs Assistance Bed Mobility: Supine to Sit     Supine to sit: Min assist     General bed mobility comments: assist with LLE   Transfers Overall transfer level: Needs assistance Equipment used: Rolling walker (2 wheeled) Transfers: Sit to/from Stand Sit to Stand: Min assist         General transfer comment: cues for hand placement and LLE position  Ambulation/Gait Ambulation/Gait assistance: Min assist Gait Distance (Feet): 25 Feet Assistive device: Rolling walker (2 wheeled) Gait Pattern/deviations: Step-to pattern;Antalgic     General Gait Details: cues for sequence and RW safety, distance limited by pain   Stairs              Wheelchair Mobility    Modified Rankin (Stroke Patients Only)       Balance                                            Cognition Arousal/Alertness: Awake/alert Behavior During Therapy: WFL for tasks assessed/performed Overall Cognitive Status: Within Functional Limits for tasks assessed                                        Exercises Total Joint Exercises Ankle Circles/Pumps: AROM;Both;15 reps Quad Sets: AROM;Both;10 reps Heel Slides: AAROM;Left;10 reps Straight Leg Raises: AAROM;Left;10 reps Goniometric ROM: 10-50 very limited due to pain . spent a lot fo time for knee flexion, even gentle stretch prolonged for 3 minutes over small bolster.     General Comments        Pertinent Vitals/Pain Pain Assessment: 0-10 Pain Score: 8  Pain Location: L knee ( lateral and quad area even to tight touch.) Pain Descriptors / Indicators: Sharp;Sore Pain Intervention(s): Monitored during session;Repositioned;Ice applied    Home Living                      Prior Function            PT Goals (current goals can now be found in the care plan section) Acute  Rehab PT Goals Patient Stated Goal: get back to active lifestyle. loves to play ball  PT Goal Formulation: With patient Time For Goal Achievement: 08/02/18 Potential to Achieve Goals: Good Progress towards PT goals: Progressing toward goals    Frequency    7X/week      PT Plan Current plan remains appropriate    Co-evaluation              AM-PAC PT "6 Clicks" Mobility   Outcome Measure  Help needed turning from your back to your side while in a flat bed without using bedrails?: A Little Help needed moving from lying on your back to sitting on the side of a flat bed without using bedrails?: A Little Help needed moving to and from a bed to a chair (including a wheelchair)?: A Little Help needed standing up from a chair using your arms (e.g., wheelchair or  bedside chair)?: A Little Help needed to walk in hospital room?: A Little Help needed climbing 3-5 steps with a railing? : A Lot 6 Click Score: 17    End of Session Equipment Utilized During Treatment: Gait belt Activity Tolerance: Patient limited by pain;Patient tolerated treatment well(however slightly improved from yesterday ) Patient left: in chair;with call bell/phone within reach;with chair alarm set Nurse Communication: Mobility status PT Visit Diagnosis: Other abnormalities of gait and mobility (R26.89);Muscle weakness (generalized) (M62.81)     Time: 3244-0102 PT Time Calculation (min) (ACUTE ONLY): 55 min  Charges:  $Gait Training: 23-37 mins $Therapeutic Exercise: 23-37 mins                     Marella Bile, PT Acute Rehabilitation Services Pager: [phone] Office: [phone] 07/29/2018    Marella Bile 07/29/2018, 2:02 PM

## 2018-07-29 NOTE — Plan of Care (Signed)
  Problem: Education: Goal: Knowledge of the prescribed therapeutic regimen will improve Outcome: Progressing Goal: Individualized Educational Video(s) Outcome: Progressing   Problem: Activity: Goal: Ability to avoid complications of mobility impairment will improve Outcome: Progressing Goal: Range of joint motion will improve Outcome: Progressing   Problem: Clinical Measurements: Goal: Postoperative complications will be avoided or minimized Outcome: Progressing   Problem: Pain Management: Goal: Pain level will decrease with appropriate interventions Outcome: Progressing   Problem: Pain Management: Goal: Pain level will decrease with appropriate interventions Outcome: Progressing   Problem: Skin Integrity: Goal: Will show signs of wound healing Outcome: Progressing   

## 2018-07-29 NOTE — Progress Notes (Signed)
   Subjective: 3 Days Post-Op Procedure(s) (LRB): TOTAL KNEE ARTHROPLASTY (Left)  Pt feels like she is not doing well Quad activation has been difficult Denies any new symptoms other than therapy being hard Patient reports pain as moderate.  Objective:   VITALS:   Vitals:   07/28/18 2159 07/29/18 0531  BP: 136/64 133/71  Pulse: 82 88  Resp: 16 16  Temp: 98.4 F (36.9 C) 98.3 F (36.8 C)  SpO2: 98% 100%    Left knee incision healing well No rashes or edema distally nv intact distally Slow quad activation as compared to right  LABS Recent Labs    07/27/18 0535 07/28/18 0514 07/29/18 0455  HGB 12.1 12.2 12.0  HCT 35.8* 36.9 35.9*  WBC 12.1* 9.3 10.6*  PLT 236 231 222    Recent Labs    07/27/18 0535  NA 133*  K 3.4*  BUN 11  CREATININE 0.74  GLUCOSE 133*     Assessment/Plan: 3 Days Post-Op Procedure(s) (LRB): TOTAL KNEE ARTHROPLASTY (Left) Continue PT/OT May need another day before d/c  Will monitor her progress today Pain management as needed    Elizebeth KollerBrad Dixon PA-C, MPAS [facility]  [place] [address], [place], [place] [address] Phone: [phone] [email or web address] Facebook  [employer]  LinkedIn  Twitter

## 2018-07-29 NOTE — Progress Notes (Signed)
Physical Therapy Treatment Patient Details Name: [name] MRN: [id number] DOB: [birth year] Today's Date: 07/29/2018    History of Present Illness 68 yo female s/p L TKR on 07/26/18. PMH includes OA, HTN, RA, R TKR, bilateral RTC repair.    PT Comments    Pt with a  Lot of pain this afternoon, swelling and redness in knee with bruising in posterior knee as well. Very sensitive to light touch with pain .  Limited session dur to pain and quad still not activating fully for lifting or ambulation. Still VERY important to use the KI for safety with mobilizing.    Follow Up Recommendations  Follow surgeon's recommendation for DC plan and follow-up therapies;Supervision for mobility/OOB     Equipment Recommendations  None recommended by PT    Recommendations for Other Services       Precautions / Restrictions Precautions Precautions: Fall;Knee Required Braces or Orthoses: Knee Immobilizer - Left Knee Immobilizer - Left: Discontinue once straight leg raise with < 10 degree lag Restrictions Weight Bearing Restrictions: No Other Position/Activity Restrictions: WBAT     Mobility  Bed Mobility Overal bed mobility: Needs Assistance Bed Mobility: Supine to Sit     Supine to sit: Min assist     General bed mobility comments: assist with LLE still unable to lift it due to no quad activation to lift at this time   Transfers Overall transfer level: Needs assistance Equipment used: Rolling walker (2 wheeled) Transfers: Sit to/from Stand Sit to Stand: Min assist         General transfer comment: cues for hand placement and LLE position  Ambulation/Gait Ambulation/Gait assistance: Mod assist Gait Distance (Feet): 15 Feet Assistive device: Rolling walker (2 wheeled) Gait Pattern/deviations: Step-to pattern     General Gait Details: limted distance due to increased pain limiting and increased weakness and diaphoretic feeling. Had to lie down    Stairs              Wheelchair Mobility    Modified Rankin (Stroke Patients Only)       Balance                                            Cognition Arousal/Alertness: Awake/alert Behavior During Therapy: WFL for tasks assessed/performed Overall Cognitive Status: Within Functional Limits for tasks assessed                                        Exercises Total Joint Exercises Ankle Circles/Pumps: AROM;Both;15 reps Quad Sets: AROM;Both;10 reps Heel Slides: AAROM;Left;10 reps Straight Leg Raises: AAROM;Left;10 reps Goniometric ROM: 10-50 very limited due to pain . spent a lot fo time for knee flexion, even gentle stretch prolonged for 3 minutes over small bolster.  Other Exercises Other Exercises: prolonged passive L knee flexion stretch (~ 45-50* for 4 mins)    General Comments        Pertinent Vitals/Pain Pain Assessment: 0-10 Pain Score: 8  Pain Location: L knee ( lateral and quad area even to tight touch.) Also a lot of pain in posterior knee area and bruised and drainage  Pain Descriptors / Indicators: Sharp;Sore Pain Intervention(s): Repositioned;Monitored during session;Ice applied    Home Living  Prior Function            PT Goals (current goals can now be found in the care plan section) Acute Rehab PT Goals Patient Stated Goal: get back to active lifestyle. loves to play ball  PT Goal Formulation: With patient Time For Goal Achievement: 08/02/18 Potential to Achieve Goals: Good Progress towards PT goals: Progressing toward goals    Frequency    7X/week      PT Plan Current plan remains appropriate    Co-evaluation              AM-PAC PT "6 Clicks" Mobility   Outcome Measure  Help needed turning from your back to your side while in a flat bed without using bedrails?: A Little Help needed moving from lying on your back to sitting on the side of a flat bed without using bedrails?: A  Little Help needed moving to and from a bed to a chair (including a wheelchair)?: A Little Help needed standing up from a chair using your arms (e.g., wheelchair or bedside chair)?: A Little Help needed to walk in hospital room?: A Little Help needed climbing 3-5 steps with a railing? : A Lot 6 Click Score: 17    End of Session Equipment Utilized During Treatment: Gait belt Activity Tolerance: Patient limited by pain;Patient tolerated treatment well(however slightly improved from yesterday ) Patient left: with call bell/phone within reach;in bed Nurse Communication: Mobility status PT Visit Diagnosis: Other abnormalities of gait and mobility (R26.89);Muscle weakness (generalized) (M62.81)     Time: 4665-9935 PT Time Calculation (min) (ACUTE ONLY): 37 min  Charges:  $Gait Training: 8-22 mins $Therapeutic Exercise: 8-22 mins                     Marella Bile, PT Acute Rehabilitation Services Pager: [phone] Office: [phone] 07/29/2018    Marella Bile 07/29/2018, 3:42 PM

## 2018-07-30 MED ORDER — KETOROLAC TROMETHAMINE 15 MG/ML IJ SOLN
7.5000 mg | Freq: Four times a day (QID) | INTRAMUSCULAR | Status: AC
  Administered 2018-07-30 – 2018-07-31 (×3): 7.5 mg via INTRAVENOUS
  Filled 2018-07-30 (×3): qty 1

## 2018-07-30 MED ORDER — NYSTATIN 100000 UNIT/ML MT SUSP
5.0000 mL | Freq: Four times a day (QID) | OROMUCOSAL | Status: DC
  Administered 2018-07-30 – 2018-07-31 (×2): 500000 [IU] via ORAL
  Filled 2018-07-30 (×2): qty 5

## 2018-07-30 NOTE — Progress Notes (Signed)
     Subjective: 4 Days Post-Op Procedure(s) (LRB): TOTAL KNEE ARTHROPLASTY (Left)   Patient reports pain as severe, worse than her previous TKA on the right.  Feels that she sin't able to bend her knee much, bruise behind the knee and lot of swelling.    Objective:   VITALS:   Vitals:   07/29/18 2154 07/30/18 0453  BP: (!) 146/68 130/63  Pulse: 80 88  Resp: 14 14  Temp: 98.7 F (37.1 C) 98.7 F (37.1 C)  SpO2: 99% 95%   Negative Homan's Dorsiflexion/Plantar flexion intact Incision: scant drainage No cellulitis present Compartment soft  LABS Recent Labs    07/28/18 0514 07/29/18 0455  HGB 12.2 12.0  HCT 36.9 35.9*  WBC 9.3 10.6*  PLT 231 222       Assessment/Plan: 4 Days Post-Op Procedure(s) (LRB): TOTAL KNEE ARTHROPLASTY (Left)   Up with therapy Discharge home eventually when ready   Anastasio Auerbach. Mccabe Gloria   PAC  07/30/2018, 9:15 AM

## 2018-07-30 NOTE — Progress Notes (Signed)
Physical Therapy Treatment Patient Details Name: [name] MRN: [id number] DOB: [birth year] Today's Date: 07/30/2018    History of Present Illness 68 yo female s/p L TKR on 07/26/18. PMH includes OA, HTN, RA, R TKR, bilateral RTC repair.    PT Comments    Pt doing better this afternoon , swelling is improving, redness is improving, some quad activation is coming back still not able to move  her leg independently yet, so KI is very important with ambulation. Will continue to follow.    Follow Up Recommendations  Follow surgeon's recommendation for DC plan and follow-up therapies;Supervision for mobility/OOB     Equipment Recommendations  None recommended by PT    Recommendations for Other Services       Precautions / Restrictions Precautions Precautions: Fall;Knee Required Braces or Orthoses: Knee Immobilizer - Left(REALLY NEEDS KI with ambulation still due to quads not activating fully ) Knee Immobilizer - Left: Discontinue once straight leg raise with < 10 degree lag Restrictions Weight Bearing Restrictions: No Other Position/Activity Restrictions: WBAT     Mobility  Bed Mobility Overal bed mobility: Needs Assistance Bed Mobility: Supine to Sit     Supine to sit: Min guard Sit to supine: Min guard   General bed mobility comments: educated and gave pt a leg lifter to assist with her LE due to quads not able to help her assist the leg on and off the bed.   Transfers Overall transfer level: Needs assistance Equipment used: Rolling walker (2 wheeled) Transfers: Sit to/from Stand Sit to Stand: Min guard            Ambulation/Gait Ambulation/Gait assistance: Min guard;+2 safety/equipment Gait Distance (Feet): 100 Feet Assistive device: Rolling walker (2 wheeled) Gait Pattern/deviations: Step-to pattern     General Gait Details: KI still due to lack of full quad activity. We tried a few stpes without it and the knee did not have control , so used the KI with  ambulation.    Stairs             Wheelchair Mobility    Modified Rankin (Stroke Patients Only)       Balance Overall balance assessment: Needs assistance Sitting-balance support: No upper extremity supported Sitting balance-Leahy Scale: Good         Standing balance comment: NT                             Cognition Arousal/Alertness: Awake/alert Behavior During Therapy: WFL for tasks assessed/performed Overall Cognitive Status: Within Functional Limits for tasks assessed                                        Exercises Total Joint Exercises Ankle Circles/Pumps: AROM;Both;15 reps Quad Sets: AROM;Both;10 reps Short Arc Quad: AAROM;10 reps;Left;Supine Heel Slides: AAROM;Left;10 reps Straight Leg Raises: AAROM;Left;10 reps    General Comments        Pertinent Vitals/Pain Pain Score: 3  Pain Descriptors / Indicators: Sore Pain Intervention(s): Ice applied;Monitored during session    Home Living                      Prior Function            PT Goals (current goals can now be found in the care plan section) Acute Rehab PT Goals Patient Stated Goal: get back  to active lifestyle. loves to play ball  PT Goal Formulation: With patient Time For Goal Achievement: 08/02/18 Potential to Achieve Goals: Good Progress towards PT goals: Progressing toward goals    Frequency    7X/week      PT Plan Current plan remains appropriate    Co-evaluation              AM-PAC PT "6 Clicks" Mobility   Outcome Measure  Help needed turning from your back to your side while in a flat bed without using bedrails?: A Little Help needed moving from lying on your back to sitting on the side of a flat bed without using bedrails?: A Little Help needed moving to and from a bed to a chair (including a wheelchair)?: A Little Help needed standing up from a chair using your arms (e.g., wheelchair or bedside chair)?: A Little Help  needed to walk in hospital room?: A Little Help needed climbing 3-5 steps with a railing? : A Lot 6 Click Score: 17    End of Session Equipment Utilized During Treatment: Gait belt Activity Tolerance: Patient limited by pain;Patient tolerated treatment well(however slightly improved from yesterday ) Patient left: with call bell/phone within reach;in bed;with family/visitor present Nurse Communication: Mobility status PT Visit Diagnosis: Other abnormalities of gait and mobility (R26.89);Muscle weakness (generalized) (M62.81)     Time: 1730-1800 PT Time Calculation (min) (ACUTE ONLY): 30 min  Charges:  $Gait Training: 8-22 mins $Therapeutic Exercise: 8-22 mins                     [name], PT Acute Rehabilitation Services Pager: [phone] Office: [phone] 07/30/2018    Hailey BileBRITT, [name] 07/30/2018, 6:19 PM

## 2018-07-30 NOTE — Plan of Care (Signed)
  Problem: Education: Goal: Knowledge of the prescribed therapeutic regimen will improve Outcome: Progressing Goal: Individualized Educational Video(s) Outcome: Progressing   Problem: Education: Goal: Individualized Educational Video(s) Outcome: Progressing   Problem: Activity: Goal: Ability to avoid complications of mobility impairment will improve Outcome: Progressing Goal: Range of joint motion will improve Outcome: Progressing   Problem: Clinical Measurements: Goal: Postoperative complications will be avoided or minimized Outcome: Progressing   Problem: Pain Management: Goal: Pain level will decrease with appropriate interventions Outcome: Progressing   Problem: Skin Integrity: Goal: Will show signs of wound healing Outcome: Progressing

## 2018-07-30 NOTE — Progress Notes (Signed)
Physical Therapy Treatment Patient Details Name: [name] MRN: [id number] DOB: [birth year] Today's Date: 07/30/2018    History of Present Illness 68 yo female s/p L TKR on 07/26/18. PMH includes OA, HTN, RA, R TKR, bilateral RTC repair.    PT Comments    Pt with improved pain today still with little activation in quads, however can see some slight contractions with sets and able to do some quad tapping to stimulate muscle a little more due to pt able to tolerate that. Walking better as well today. Will continue to follow.    Follow Up Recommendations  Follow surgeon's recommendation for DC plan and follow-up therapies;Supervision for mobility/OOB     Equipment Recommendations  None recommended by PT    Recommendations for Other Services       Precautions / Restrictions Precautions Precautions: Fall;Knee Required Braces or Orthoses: Knee Immobilizer - Left(REALLY NEEDS KI with ambulation still due to quads not activating fully ) Knee Immobilizer - Left: Discontinue once straight leg raise with < 10 degree lag Restrictions Weight Bearing Restrictions: No Other Position/Activity Restrictions: WBAT     Mobility  Bed Mobility Overal bed mobility: Needs Assistance Bed Mobility: Supine to Sit     Supine to sit: Min guard Sit to supine: Min guard   General bed mobility comments: used gait belt to assist and lift the other leg today. Still nto able to lift or move her leg independently yet  Transfers Overall transfer level: Needs assistance Equipment used: Rolling walker (2 wheeled) Transfers: Sit to/from Stand Sit to Stand: Min guard            Ambulation/Gait Ambulation/Gait assistance: Min guard;+2 safety/equipment Gait Distance (Feet): 65 Feet Assistive device: Rolling walker (2 wheeled)       General Gait Details: better today, no dizziness ror wekaness, sitill needs KI die to very little quad activation    Stairs             Wheelchair Mobility    Modified Rankin (Stroke Patients Only)       Balance Overall balance assessment: Needs assistance Sitting-balance support: No upper extremity supported Sitting balance-Leahy Scale: Good         Standing balance comment: NT                             Cognition Arousal/Alertness: Awake/alert Behavior During Therapy: WFL for tasks assessed/performed Overall Cognitive Status: Within Functional Limits for tasks assessed                                        Exercises Total Joint Exercises Ankle Circles/Pumps: AROM;Both;15 reps Quad Sets: AROM;Both;10 reps Heel Slides: AAROM;Left;10 reps Straight Leg Raises: AAROM;Left;10 reps Goniometric ROM: 0-50 Other Exercises Other Exercises: prolonged passive L knee flexion stretch (~ 45-50* for 4 mins)    General Comments        Pertinent Vitals/Pain Pain Score: 4  Pain Location: L knee still medial and lateral, a little less on upper thigh today than yesterday. still swollen and tight  Pain Descriptors / Indicators: Sharp;Sore Pain Intervention(s): Monitored during session;Ice applied;Repositioned    Home Living                      Prior Function            PT Goals (current  goals can now be found in the care plan section) Acute Rehab PT Goals Patient Stated Goal: get back to active lifestyle. loves to play ball  PT Goal Formulation: With patient Time For Goal Achievement: 08/02/18 Potential to Achieve Goals: Good Progress towards PT goals: Progressing toward goals    Frequency    7X/week      PT Plan Current plan remains appropriate    Co-evaluation              AM-PAC PT "6 Clicks" Mobility   Outcome Measure  Help needed turning from your back to your side while in a flat bed without using bedrails?: A Little Help needed moving from lying on your back to sitting on the side of a flat bed without using bedrails?: A Little Help needed moving to and from a bed  to a chair (including a wheelchair)?: A Little Help needed standing up from a chair using your arms (e.g., wheelchair or bedside chair)?: A Little Help needed to walk in hospital room?: A Little Help needed climbing 3-5 steps with a railing? : A Lot 6 Click Score: 17    End of Session Equipment Utilized During Treatment: Gait belt Activity Tolerance: Patient limited by pain;Patient tolerated treatment well(however slightly improved from yesterday ) Patient left: with call bell/phone within reach;in bed;with family/visitor present Nurse Communication: Mobility status PT Visit Diagnosis: Other abnormalities of gait and mobility (R26.89);Muscle weakness (generalized) (M62.81)     Time: 3267-1245 PT Time Calculation (min) (ACUTE ONLY): 60 min  Charges:  $Gait Training: 23-37 mins $Therapeutic Exercise: 8-22 mins $Therapeutic Activity: 8-22 mins                     Marella Bile, PT Acute Rehabilitation Services Pager: [phone] Office: [phone] 07/30/2018    Marella Bile 07/30/2018, 12:50 PM

## 2018-07-31 NOTE — Progress Notes (Signed)
Physical Therapy Treatment Patient Details Name: [name] MRN: [id number] DOB: Jun 22, 1951 Today's Date: 07/31/2018    History of Present Illness 68 yo female s/p L TKR on 07/26/18. PMH includes OA, HTN, RA, R TKR, bilateral RTC repair.    PT Comments    Ready for d/c, has OPPT appt tomorrow; RN aware  Follow Up Recommendations  Follow surgeon's recommendation for DC plan and follow-up therapies;Supervision for mobility/OOB     Equipment Recommendations  None recommended by PT    Recommendations for Other Services       Precautions / Restrictions Precautions Precautions: Fall;Knee Required Braces or Orthoses: Knee Immobilizer - Left Knee Immobilizer - Left: Discontinue once straight leg raise with < 10 degree lag Restrictions Weight Bearing Restrictions: No Other Position/Activity Restrictions: WBAT     Mobility  Bed Mobility Overal bed mobility: Needs Assistance Bed Mobility: Supine to Sit     Supine to sit: Supervision     General bed mobility comments: using leg lifter  Transfers Overall transfer level: Needs assistance Equipment used: Rolling walker (2 wheeled) Transfers: Sit to/from Stand Sit to Stand: Supervision         General transfer comment: cues for hand placement and LLE position  Ambulation/Gait Ambulation/Gait assistance: Supervision;Modified independent (Device/Increase time) Gait Distance (Feet): 120 Feet Assistive device: Rolling walker (2 wheeled) Gait Pattern/deviations: Step-to pattern     General Gait Details: cues for sequence, step length, safety with turns   Stairs             Wheelchair Mobility    Modified Rankin (Stroke Patients Only)       Balance                                            Cognition Arousal/Alertness: Awake/alert Behavior During Therapy: WFL for tasks assessed/performed Overall Cognitive Status: Within Functional Limits for tasks assessed                                        Exercises Total Joint Exercises Ankle Circles/Pumps: AROM;Both;15 reps Quad Sets: AROM;Both;10 reps Short Arc Quad: AAROM;10 reps;Left;Supine Heel Slides: AAROM;Left;10 reps Hip ABduction/ADduction: AAROM;Left;10 reps Straight Leg Raises: AAROM;Left;10 reps Goniometric ROM: grossly 9* to 65* AAROM L knee flexion    General Comments        Pertinent Vitals/Pain Pain Assessment: 0-10 Pain Score: 3  Pain Location: L knee  Pain Descriptors / Indicators: Sore Pain Intervention(s): Limited activity within patient's tolerance;Monitored during session;Premedicated before session;Repositioned;Ice applied    Home Living                      Prior Function            PT Goals (current goals can now be found in the care plan section) Acute Rehab PT Goals Patient Stated Goal: get back to active lifestyle. loves to play ball  PT Goal Formulation: With patient Time For Goal Achievement: 08/02/18 Potential to Achieve Goals: Good Progress towards PT goals: Progressing toward goals    Frequency    7X/week      PT Plan Current plan remains appropriate    Co-evaluation              AM-PAC PT "6 Clicks" Mobility   Outcome  Measure  Help needed turning from your back to your side while in a flat bed without using bedrails?: A Little Help needed moving from lying on your back to sitting on the side of a flat bed without using bedrails?: A Little Help needed moving to and from a bed to a chair (including a wheelchair)?: A Little Help needed standing up from a chair using your arms (e.g., wheelchair or bedside chair)?: A Little Help needed to walk in hospital room?: A Little Help needed climbing 3-5 steps with a railing? : A Lot 6 Click Score: 17    End of Session Equipment Utilized During Treatment: Gait belt Activity Tolerance: Patient tolerated treatment well Patient left: with call bell/phone within reach;in chair;with family/visitor  present Nurse Communication: Mobility status PT Visit Diagnosis: Other abnormalities of gait and mobility (R26.89);Muscle weakness (generalized) (M62.81)     Time: 6712-4580 PT Time Calculation (min) (ACUTE ONLY): 44 min  Charges:  $Gait Training: 23-37 mins $Therapeutic Exercise: 8-22 mins                     Drucilla Chalet, PT  Pager: [phone] Acute Rehab Dept [facility]): [phone]   07/31/2018    [facility] 07/31/2018, 10:49 AM

## 2018-07-31 NOTE — Progress Notes (Signed)
Subjective: 5 Days Post-Op Procedure(s) (LRB): TOTAL KNEE ARTHROPLASTY (Left) Patient reports pain as 3 on 0-10 scale.    Objective: Vital signs in last 24 hours: Temp:  [98.4 F (36.9 C)-99.2 F (37.3 C)] 98.7 F (37.1 C) (02/03 0602) Pulse Rate:  [76-83] 76 (02/03 0602) Resp:  [16] 16 (02/03 0602) BP: (113-119)/(65-70) 119/67 (02/03 0602) SpO2:  [95 %-99 %] 99 % (02/03 0602)  Intake/Output from previous day: 02/02 0701 - 02/03 0700 In: 840 [P.O.:840] Out: -  Intake/Output this shift: No intake/output data recorded.  Recent Labs    07/29/18 0455  HGB 12.0   Recent Labs    07/29/18 0455  WBC 10.6*  RBC 3.69*  HCT 35.9*  PLT 222   No results for input(s): NA, K, CL, CO2, BUN, CREATININE, GLUCOSE, CALCIUM in the last 72 hours. No results for input(s): LABPT, INR in the last 72 hours.  Neurovascular intact Sensation intact distally Incision: dressing C/D/I No cellulitis present Exam per nursing  Assessment/Plan: 5 Days Post-Op Procedure(s) (LRB): TOTAL KNEE ARTHROPLASTY (Left) Advance diet Discharge home with home health  Discussed with patient by phone    [name] 07/31/2018, 12:50 PM

## 2018-07-31 NOTE — Plan of Care (Signed)

## 2018-07-31 NOTE — Care Management Important Message (Signed)
Important Message  Patient Details  Name: REYANA VALERO MRN: [id number] Date of Birth: June 23, 1951   Medicare Important Message Given:  Yes    Caren Macadam 07/31/2018, 12:16 PMImportant Message  Patient Details  Name: SHUWANDA RIBORDY MRN: [id number] Date of Birth: [birth year]   Medicare Important Message Given:  Yes    Caren Macadam 07/31/2018, 12:16 PM

## 2019-05-29 IMAGING — DX DG KNEE 1-2V PORT*L*
2 series · 2 of 2 positions shown · non-contrast
Comparison: Left knee x-rays dated February 11, 2009.

CLINICAL DATA: Total knee replacement.

EXAM:
PORTABLE LEFT KNEE - 1-2 VIEW

[knee ap]
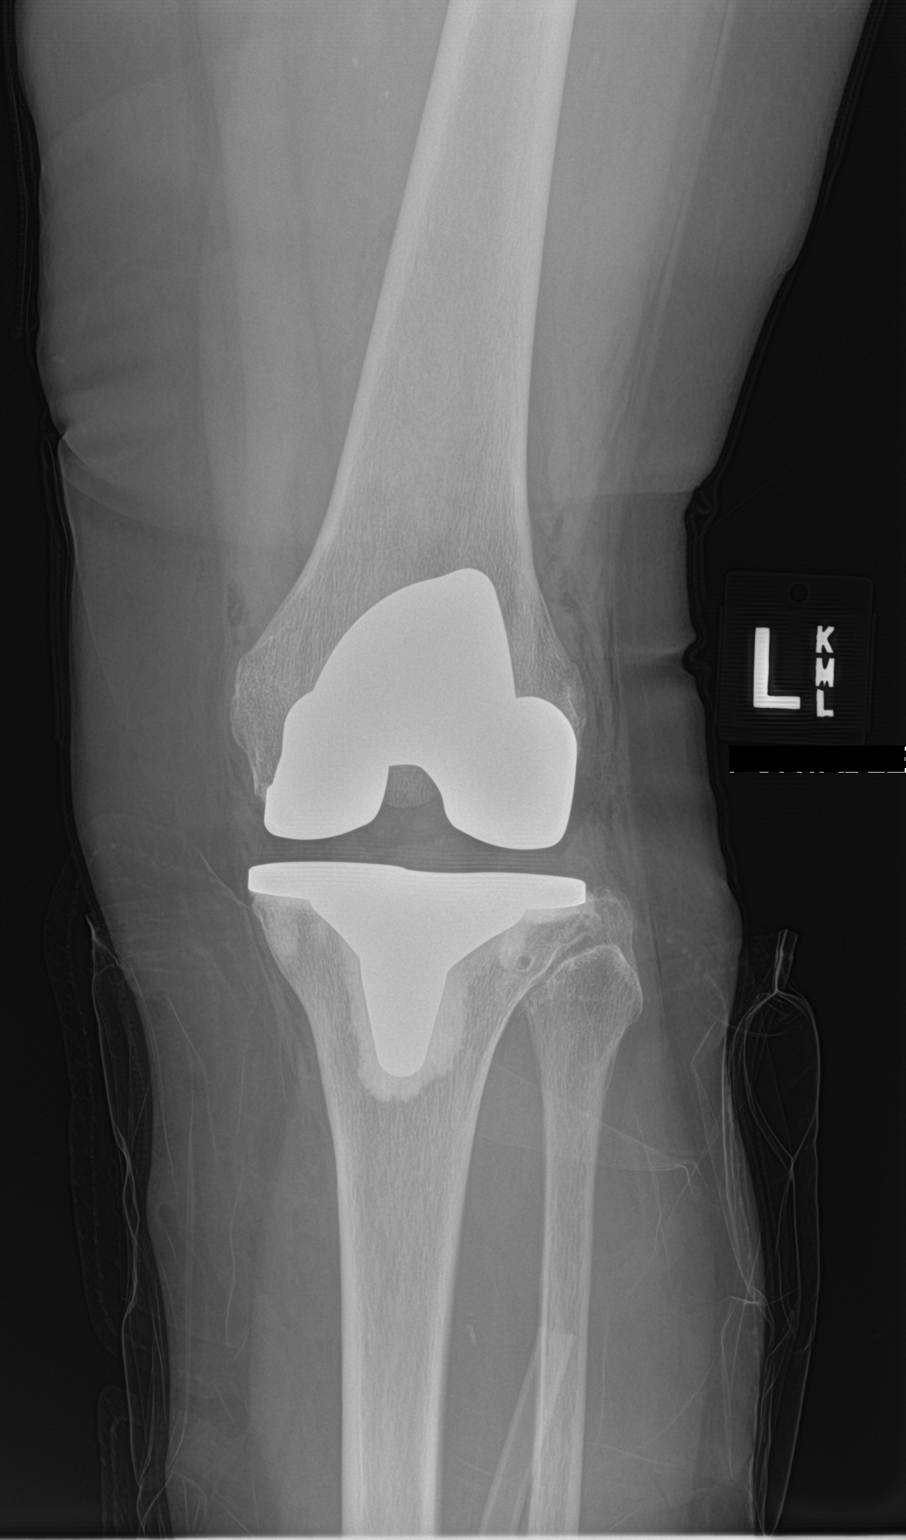

[knee lat]
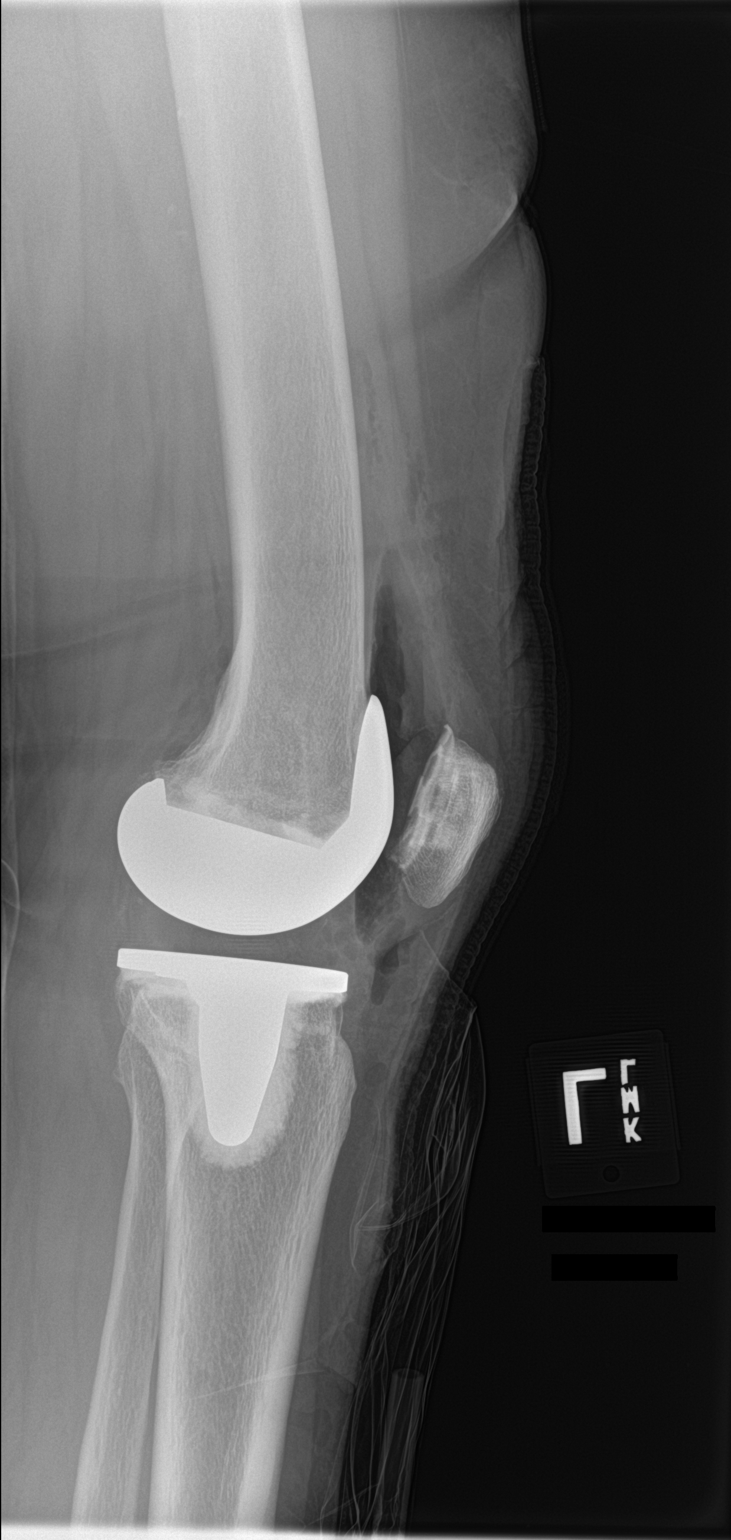

[2 of 2 positions shown; findings below may reference images not displayed]

FINDINGS: The left knee demonstrates a total knee arthroplasty without
evidence of hardware failure or complication. There is expected
intra-articular air. There is no fracture or dislocation. The
alignment is anatomic. Post-surgical changes noted in the
surrounding soft tissues.
IMPRESSION: 1. Interval left total knee arthroplasty without evidence of acute
postoperative complication.

## 2021-11-26 ENCOUNTER — Ambulatory Visit: Payer: Self-pay | Admitting: Orthopedic Surgery

## 2021-11-26 NOTE — H&P (Signed)
[name] is an 71 y.o. female.   Chief Complaint: R shoulder pain HPI: Visit For: Follow up Hand Dominance: right Location: right; shoulder Duration: date of onset: (March 2023); ~2 months Quality: worsening Severity: pain level 10/10 Context: no known injury - reached into the cabinet for a can of food and had immediate pain Alleviating Factors: rest; OTC medication (tylenol prn); states she has tramadol that she takes sometimes Aggravating Factors: lifting; ROM; weightbearing Previous Surgery: date: (90s) Prior Imaging: x ray Previous Injections: did not help; 09/04/21 US guided aspiration/injection right shoulder by Dr. Garlan Fillers states she had relief for maybe two days, but pain returned  Past Medical History:  Diagnosis Date   Acute meniscal tear of left knee    Arthritis    Asthma due to environmental allergies    Complication of anesthesia    Degenerative disc disease, lumbar    Diverticulosis of sigmoid colon    Fibromyalgia    Headache    Migraines   History of gastric ulcer    History of kidney stones    Hypertension    Measles    Mumps    PONV (postoperative nausea and vomiting) 1980's   RA (rheumatoid arthritis) ([place])    Shingles 09/2017   Swelling of right knee joint    S/P   ARTHROSCOPY -- 09-07-2013   Varicose veins of both lower extremities    Wears glasses     Past Surgical History:  Procedure Laterality Date   APPENDECTOMY  1976   KNEE ARTHROSCOPY Left 12/07/2013   Procedure: ARTHROSCOPY LEFT KNEE WITH DEBRIDEMENT;  Surgeon: Johnn Hai, MD;  Location: [place];  Service: Orthopedics;  Laterality: Left;   KNEE ARTHROSCOPY WITH LATERAL MENISECTOMY Right 09/07/2013   Procedure: RIGHT KNEE ARTHROSCOPY WITH PARTIAL MEDIAL AND LATERAL MENISECTOMY/DEBRIDEMENT/ ;  Surgeon: Johnn Hai, MD;  Location: [place];  Service: Orthopedics;  Laterality: Right;   MENISCUS REPAIR Bilateral    2015 Right, 2015 Left    SHOULDER OPEN ROTATOR CUFF REPAIR Bilateral right  1993/   left  1998   TARSAL TUNNEL RELEASE Bilateral right 2010/   left 2009   feet   TOTAL KNEE ARTHROPLASTY Right 07/22/2016   Procedure: Right TOTAL KNEE ARTHROPLASTY;  Surgeon: Susa Day, MD;  Location: [facility];  Service: Orthopedics;  Laterality: Right;  Requests 2 hours   TOTAL KNEE ARTHROPLASTY Left 07/26/2018   Procedure: TOTAL KNEE ARTHROPLASTY;  Surgeon: Susa Day, MD;  Location: [facility];  Service: Orthopedics;  Laterality: Left;  2 hrs   VAGINAL HYSTERECTOMY  1983    No family history on file. Social History:  reports that she quit smoking about 22 years ago. Her smoking use included cigarettes. She has a 15.00 pack-year smoking history. She has never used smokeless tobacco. She reports that she does not drink alcohol and does not use drugs.  Allergies:  Allergies  Allergen Reactions   Asa [Aspirin] Other (See Comments)    Avoids asa 325mg , causes right side facial numbness and gi upset/   Pt can take asa 81mg  with no issues   Iohexol Hives    Patient needs 13 hour prep before ct scans with contrast   Methadone Other (See Comments)    unknown   Oxycodone Other (See Comments)    Caused sores/lesions in nose    Sulfa Antibiotics Hives   Zostavax [Zoster Vaccine Live] Other (See Comments)    Made pt feel sick with flu  Pregabalin Other (See Comments)    Blisters in throat    Current meds: acetaminophen 500 mg tablet Advair Diskus 250 mcg-50 mcg/dose powder for inhalation albuterol sulfate 1.25 mg/3 mL solution for nebulization amLODIPine 5 mg tablet cholecalciferol (vitamin D3) 50 mcg (2,000 unit) tablet Cimzia Powder for Recon 400 mg (200 mg x 2 vials) hydrOXYchloroQUINE 200 mg tablet indapamide 1.25 mg tablet magnesium oxide 500 mg capsule potassium chloride ER 10 mEq tablet,extended release predniSONE 5 mg tablets in a dose pack raNITIdine 150 mg capsule sodium chloride 3 % for nebulization Ventolin HFA  90 mcg/actuation aerosol inhaler zolpidem 10 mg tablet  Review of Systems  Constitutional: Negative.   HENT: Negative.    Eyes: Negative.   Respiratory: Negative.    Cardiovascular: Negative.   Gastrointestinal: Negative.   Endocrine: Negative.   Genitourinary: Negative.   Musculoskeletal:  Positive for arthralgias, gait problem, joint swelling and myalgias.  Skin: Negative.   Hematological: Negative.   Psychiatric/Behavioral: Negative.     There were no vitals taken for this visit. Physical Exam Constitutional:      Appearance: Normal appearance.  HENT:     Head: Normocephalic and atraumatic.     Right Ear: External ear normal.     Left Ear: External ear normal.     Nose: Nose normal.     Mouth/Throat:     Pharynx: Oropharynx is clear.  Eyes:     Conjunctiva/sclera: Conjunctivae normal.  Cardiovascular:     Rate and Rhythm: Normal rate and regular rhythm.     Pulses: Normal pulses.     Heart sounds: Normal heart sounds.  Pulmonary:     Effort: Pulmonary effort is normal.  Abdominal:     General: Bowel sounds are normal.  Musculoskeletal:     Cervical back: Normal range of motion and neck supple.     Comments: Positive impingement sign positive secondary impingement sign of the right shoulder. Nontender over the [facility]. No significant pain in the deltopectoral groove.  Skin:    General: Skin is warm and dry.  Neurological:     Mental Status: She is alert.    MRI again demonstrates a greater than 50% thickness tear of the supraspinatus. Previous distal clavicle resection. Some areas of high-grade chondral loss of the humeral head.  X-rays demonstrate a greater than 1 cm previous distal clavicle resection. A type II acromion. Minimal glenohumeral narrowing.  Assessment/Plan Impression:  1. Persistent refractory right shoulder pain secondary to high-grade tear of the supraspinatus recurrent 2. Status post distal clavicle resection 3. 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 I do not feel at this point in time that she would require a shoulder arthroplasty. We discussed completing the tear surgically and then repairing it as well as arthroscopically assisted. We will would require a patch graft given that it is a revision. She has a previous surgical incision that is longitudinal. The previous incision does not appear to have utility.  An extensive discussion concerning the pathology relevant anatomy and treatment options. After that discussion we mutually agreed to proceed with repair of the rotator cuff utilizing arthroscopic assistance if possible. The risks and benefits of that procedure were discussed including bleeding, infection, suboptimal range of motion, deep venous thrombosis, pulmonary embolism, anesthetic complications etc. in addition we discussed the postoperative course to include approximately 4 weeks of passive range of motion followed by 4 weeks of active  range of motion followed by 4-12 weeks of progressive strengthening exercises. In addition we discussed protective activities to reduce the risk of a reinjury including impingement activities with elbow above the shoulder as well as reaching and repetitive circular motion activities. The hospital stay will either be as a outpatient with a regional block versus overnight depending upon the extent of the procedure and any challenging health issues with a first postoperative visit 2 weeks following the surgery.  She is taken tramadol now. Unable to take hydrocodone or oxycodone.  She is okay with the Dilaudid.  Outpatient. Utilization of a block  She will need to be off her RA medications. She apparently has infusion every 8 weeks  Plan R shoulder scope, SAD, revision mini-open RCR with probable patch graft  Cecilie Kicks, PA-C for Dr Tonita Cong 11/26/2021, 2:17 PM

## 2021-11-26 NOTE — H&P (View-Only) (Signed)
[name] is an 71 y.o. female.   Chief Complaint: R shoulder pain HPI: Visit For: Follow up Hand Dominance: right Location: right; shoulder Duration: date of onset: (March 2023); ~2 months Quality: worsening Severity: pain level 10/10 Context: no known injury - reached into the cabinet for a can of food and had immediate pain Alleviating Factors: rest; OTC medication (tylenol prn); states she has tramadol that she takes sometimes Aggravating Factors: lifting; ROM; weightbearing Previous Surgery: date: (90s) Prior Imaging: x ray Previous Injections: did not help; 09/04/21 US guided aspiration/injection right shoulder by Dr. Garlan Fillers states she had relief for maybe two days, but pain returned  Past Medical History:  Diagnosis Date   Acute meniscal tear of left knee    Arthritis    Asthma due to environmental allergies    Complication of anesthesia    Degenerative disc disease, lumbar    Diverticulosis of sigmoid colon    Fibromyalgia    Headache    Migraines   History of gastric ulcer    History of kidney stones    Hypertension    Measles    Mumps    PONV (postoperative nausea and vomiting) 1980's   RA (rheumatoid arthritis) ([place])    Shingles 09/2017   Swelling of right knee joint    S/P   ARTHROSCOPY -- 09-07-2013   Varicose veins of both lower extremities    Wears glasses     Past Surgical History:  Procedure Laterality Date   APPENDECTOMY  1976   KNEE ARTHROSCOPY Left 12/07/2013   Procedure: ARTHROSCOPY LEFT KNEE WITH DEBRIDEMENT;  Surgeon: Johnn Hai, MD;  Location: [place];  Service: Orthopedics;  Laterality: Left;   KNEE ARTHROSCOPY WITH LATERAL MENISECTOMY Right 09/07/2013   Procedure: RIGHT KNEE ARTHROSCOPY WITH PARTIAL MEDIAL AND LATERAL MENISECTOMY/DEBRIDEMENT/ ;  Surgeon: Johnn Hai, MD;  Location: [place];  Service: Orthopedics;  Laterality: Right;   MENISCUS REPAIR Bilateral    2015 Right, 2015 Left    SHOULDER OPEN ROTATOR CUFF REPAIR Bilateral right  1993/   left  1998   TARSAL TUNNEL RELEASE Bilateral right 2010/   left 2009   feet   TOTAL KNEE ARTHROPLASTY Right 07/22/2016   Procedure: Right TOTAL KNEE ARTHROPLASTY;  Surgeon: Susa Day, MD;  [facility];  Service: Orthopedics;  Laterality: Right;  Requests 2 hours   TOTAL KNEE ARTHROPLASTY Left 07/26/2018   Procedure: TOTAL KNEE ARTHROPLASTY;  Surgeon: Susa Day, MD;  [facility];  Service: Orthopedics;  Laterality: Left;  2 hrs   VAGINAL HYSTERECTOMY  1983    No family history on file. Social History:  reports that she quit smoking about 22 years ago. Her smoking use included cigarettes. She has a 15.00 pack-year smoking history. She has never used smokeless tobacco. She reports that she does not drink alcohol and does not use drugs.  Allergies:  Allergies  Allergen Reactions   Asa [Aspirin] Other (See Comments)    Avoids asa 325mg , causes right side facial numbness and gi upset/   Pt can take asa 81mg  with no issues   Iohexol Hives    Patient needs 13 hour prep before ct scans with contrast   Methadone Other (See Comments)    unknown   Oxycodone Other (See Comments)    Caused sores/lesions in nose    Sulfa Antibiotics Hives   Zostavax [Zoster Vaccine Live] Other (See Comments)    Made pt feel sick with flu  Pregabalin Other (See Comments)    Blisters in throat    Current meds: acetaminophen 500 mg tablet Advair Diskus 250 mcg-50 mcg/dose powder for inhalation albuterol sulfate 1.25 mg/3 mL solution for nebulization amLODIPine 5 mg tablet cholecalciferol (vitamin D3) 50 mcg (2,000 unit) tablet Cimzia Powder for Recon 400 mg (200 mg x 2 vials) hydrOXYchloroQUINE 200 mg tablet indapamide 1.25 mg tablet magnesium oxide 500 mg capsule potassium chloride ER 10 mEq tablet,extended release predniSONE 5 mg tablets in a dose pack raNITIdine 150 mg capsule sodium chloride 3 % for nebulization Ventolin HFA  90 mcg/actuation aerosol inhaler zolpidem 10 mg tablet  Review of Systems  Constitutional: Negative.   HENT: Negative.    Eyes: Negative.   Respiratory: Negative.    Cardiovascular: Negative.   Gastrointestinal: Negative.   Endocrine: Negative.   Genitourinary: Negative.   Musculoskeletal:  Positive for arthralgias, gait problem, joint swelling and myalgias.  Skin: Negative.   Hematological: Negative.   Psychiatric/Behavioral: Negative.     There were no vitals taken for this visit. Physical Exam Constitutional:      Appearance: Normal appearance.  HENT:     Head: Normocephalic and atraumatic.     Right Ear: External ear normal.     Left Ear: External ear normal.     Nose: Nose normal.     Mouth/Throat:     Pharynx: Oropharynx is clear.  Eyes:     Conjunctiva/sclera: Conjunctivae normal.  Cardiovascular:     Rate and Rhythm: Normal rate and regular rhythm.     Pulses: Normal pulses.     Heart sounds: Normal heart sounds.  Pulmonary:     Effort: Pulmonary effort is normal.  Abdominal:     General: Bowel sounds are normal.  Musculoskeletal:     Cervical back: Normal range of motion and neck supple.     Comments: Positive impingement sign positive secondary impingement sign of the right shoulder. Nontender over the [facility]. No significant pain in the deltopectoral groove.  Skin:    General: Skin is warm and dry.  Neurological:     Mental Status: She is alert.    MRI again demonstrates a greater than 50% thickness tear of the supraspinatus. Previous distal clavicle resection. Some areas of high-grade chondral loss of the humeral head.  X-rays demonstrate a greater than 1 cm previous distal clavicle resection. A type II acromion. Minimal glenohumeral narrowing.  Assessment/Plan Impression:  1. Persistent refractory right shoulder pain secondary to high-grade tear of the supraspinatus recurrent 2. Status post distal clavicle resection 3. 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 I do not feel at this point in time that she would require a shoulder arthroplasty. We discussed completing the tear surgically and then repairing it as well as arthroscopically assisted. We will would require a patch graft given that it is a revision. She has a previous surgical incision that is longitudinal. The previous incision does not appear to have utility.  An extensive discussion concerning the pathology relevant anatomy and treatment options. After that discussion we mutually agreed to proceed with repair of the rotator cuff utilizing arthroscopic assistance if possible. The risks and benefits of that procedure were discussed including bleeding, infection, suboptimal range of motion, deep venous thrombosis, pulmonary embolism, anesthetic complications etc. in addition we discussed the postoperative course to include approximately 4 weeks of passive range of motion followed by 4 weeks of active  range of motion followed by 4-12 weeks of progressive strengthening exercises. In addition we discussed protective activities to reduce the risk of a reinjury including impingement activities with elbow above the shoulder as well as reaching and repetitive circular motion activities. The hospital stay will either be as a outpatient with a regional block versus overnight depending upon the extent of the procedure and any challenging health issues with a first postoperative visit 2 weeks following the surgery.  She is taken tramadol now. Unable to take hydrocodone or oxycodone.  She is okay with the Dilaudid.  Outpatient. Utilization of a block  She will need to be off her RA medications. She apparently has infusion every 8 weeks  Plan R shoulder scope, SAD, revision mini-open RCR with probable patch graft  Cecilie Kicks, PA-C for Dr Tonita Cong 11/26/2021, 2:17 PM

## 2021-12-10 NOTE — Progress Notes (Signed)
DUE TO COVID-19 ONLY  2  VISITOR IS ALLOWED TO COME WITH YOU AND STAY IN THE WAITING ROOM ONLY DURING PRE OP AND PROCEDURE DAY OF SURGERY.  4  VISITOR  MAY VISIT WITH YOU AFTER SURGERY IN YOUR PRIVATE ROOM DURING VISITING HOURS ONLY! YOU MAY HAVE ONE PERSON SPEND THE NITE WITH YOU IN YOUR ROOM AFTER SURGERY.       Your procedure is scheduled on:        12/23/21   Report to [facility] Main  Entrance   Report to admitting at    0830             AM DO NOT BRING INSURANCE CARD, PICTURE ID OR WALLET DAY OF SURGERY.      Call this number if you have problems the morning of surgery [phone]    REMEMBER: NO  SOLID FOODS , CANDY, GUM OR MINTS AFTER MIDNITE THE NITE BEFORE SURGERY .       Marland Kitchen CLEAR LIQUIDS UNTIL    0745am             DAY OF SURGERY.      PLEASE FINISH ENSURE DRINK PER SURGEON ORDER  WHICH NEEDS TO BE COMPLETED AT      0745am    MORNING OF SURGERY.       CLEAR LIQUID DIET   Foods Allowed      WATER BLACK COFFEE ( SUGAR OK, NO MILK, CREAM OR CREAMER) REGULAR AND DECAF  TEA ( SUGAR OK NO MILK, CREAM, OR CREAMER) REGULAR AND DECAF  PLAIN JELLO ( NO RED)  FRUIT ICES ( NO RED, NO FRUIT PULP)  POPSICLES ( NO RED)  JUICE- APPLE, WHITE GRAPE AND WHITE CRANBERRY  SPORT DRINK LIKE GATORADE ( NO RED)  CLEAR BROTH ( VEGETABLE , CHICKEN OR BEEF)                                                                     BRUSH YOUR TEETH MORNING OF SURGERY AND RINSE YOUR MOUTH OUT, NO CHEWING GUM CANDY OR MINTS.     Take these medicines the morning of surgery with A SIP OF WATER:  inhalers as usual and bring, amlodipine, zyrtec    DO NOT TAKE ANY DIABETIC MEDICATIONS DAY OF YOUR SURGERY                               You may not have any metal on your body including hair pins and              piercings  Do not wear jewelry, make-up, lotions, powders or perfumes, deodorant             Do not wear nail polish on your fingernails.              IF YOU ARE A FEMALE AND WANT TO  SHAVE UNDER ARMS OR LEGS PRIOR TO SURGERY YOU MUST DO SO AT LEAST 48 HOURS PRIOR TO SURGERY.              Men may shave face and neck.   Do not bring valuables to the hospital. [facility] IS NOT  RESPONSIBLE   FOR VALUABLES.  Contacts, dentures or bridgework may not be worn into surgery.  Leave suitcase in the car. After surgery it may be brought to your room.     Patients discharged the day of surgery will not be allowed to drive home. IF YOU ARE HAVING SURGERY AND GOING HOME THE SAME DAY, YOU MUST HAVE AN ADULT TO DRIVE YOU HOME AND BE WITH YOU FOR 24 HOURS. YOU MAY GO HOME BY TAXI OR UBER OR ORTHERWISE, BUT AN ADULT MUST ACCOMPANY YOU HOME AND STAY WITH YOU FOR 24 HOURS.                Please read over the following fact sheets you were given: _____________________________________________________________________  [facility] - Preparing for Surgery Before surgery, you can play an important role.  Because skin is not sterile, your skin needs to be as free of germs as possible.  You can reduce the number of germs on your skin by washing with CHG (chlorahexidine gluconate) soap before surgery.  CHG is an antiseptic cleaner which kills germs and bonds with the skin to continue killing germs even after washing. Please DO NOT use if you have an allergy to CHG or antibacterial soaps.  If your skin becomes reddened/irritated stop using the CHG and inform your nurse when you arrive at Short Stay. Do not shave (including legs and underarms) for at least 48 hours prior to the first CHG shower.  You may shave your face/neck. Please follow these instructions carefully:  1.  Shower with CHG Soap the night before surgery and the  morning of Surgery.  2.  If you choose to wash your hair, wash your hair first as usual with your  normal  shampoo.  3.  After you shampoo, rinse your hair and body thoroughly to remove the  shampoo.                           4.  Use CHG as you would any other liquid  soap.  You can apply chg directly  to the skin and wash                       Gently with a scrungie or clean washcloth.  5.  Apply the CHG Soap to your body ONLY FROM THE NECK DOWN.   Do not use on face/ open                           Wound or open sores. Avoid contact with eyes, ears mouth and genitals (private parts).                       Wash face,  Genitals (private parts) with your normal soap.             6.  Wash thoroughly, paying special attention to the area where your surgery  will be performed.  7.  Thoroughly rinse your body with warm water from the neck down.  8.  DO NOT shower/wash with your normal soap after using and rinsing off  the CHG Soap.                9.  Pat yourself dry with a clean towel.            10.  Wear clean pajamas.  11.  Place clean sheets on your bed the night of your first shower and do not  sleep with pets. Day of Surgery : Do not apply any lotions/deodorants the morning of surgery.  Please wear clean clothes to the hospital/surgery center.  FAILURE TO FOLLOW THESE INSTRUCTIONS MAY RESULT IN THE CANCELLATION OF YOUR SURGERY PATIENT SIGNATURE_________________________________  NURSE SIGNATURE__________________________________  ________________________________________________________________________

## 2021-12-10 NOTE — Progress Notes (Signed)
Anesthesia Review:  PCP: Benedetto Goad LOV 10/15/21  Cardiologist : Chest x-ray : EKG : Echo : Stress test: Cardiac Cath :  Activity level:  Sleep Study/ CPAP : Fasting Blood Sugar :      / Checks Blood Sugar -- times a day:   Blood Thinner/ Instructions /Last Dose: ASA / Instructions/ Last Dose :   Xarelto

## 2021-12-15 ENCOUNTER — Other Ambulatory Visit: Payer: Self-pay

## 2021-12-15 ENCOUNTER — Encounter (HOSPITAL_COMMUNITY): Payer: Self-pay

## 2021-12-15 ENCOUNTER — Encounter (HOSPITAL_COMMUNITY)
Admission: RE | Admit: 2021-12-15 | Discharge: 2021-12-15 | Disposition: A | Discharge status: Home / Self Care | Payer: Medicare Other | Source: Ambulatory Visit | Attending: Specialist | Admitting: Specialist

## 2021-12-15 DIAGNOSIS — Z01818 Encounter for other preprocedural examination: Secondary | ICD-10-CM | POA: Insufficient documentation

## 2021-12-15 HISTORY — DX: Gastro-esophageal reflux disease without esophagitis: K21.9

## 2021-12-15 LAB — BASIC METABOLIC PANEL
Anion gap: 9 (ref 5–15)
BUN: 17 mg/dL (ref 8–23)
CO2: 30 mmol/L (ref 22–32)
Calcium: 9.6 mg/dL (ref 8.9–10.3)
Chloride: 102 mmol/L (ref 98–111)
Creatinine, Ser: 0.95 mg/dL (ref 0.44–1.00)
GFR, Estimated: >60 mL/min (ref >60)
Glucose, Bld: 93 mg/dL (ref 70–99)
Potassium: 3.6 mmol/L (ref 3.5–5.1)
Sodium: 141 mmol/L (ref 135–145)

## 2021-12-15 LAB — CBC
HCT: 41.4 % (ref 36.0–46.0)
Hemoglobin: 14.3 g/dL (ref 12.0–15.0)
MCH: 33.4 pg (ref 26.0–34.0)
MCHC: 34.5 g/dL (ref 30.0–36.0)
MCV: 96.7 fL (ref 80.0–100.0)
Platelets: 262 10*3/uL (ref 150–400)
RBC: 4.28 MIL/uL (ref 3.87–5.11)
RDW: 13.1 % (ref 11.5–15.5)
WBC: 6.5 10*3/uL (ref 4.0–10.5)
nRBC: 0 % (ref 0.0–0.2)

## 2021-12-22 NOTE — Anesthesia Preprocedure Evaluation (Addendum)
Anesthesia Evaluation  Patient identified by MRN, date of birth, ID band Patient awake    Reviewed: Allergy & Precautions, NPO status , Patient's Chart, lab work & pertinent test results  History of Anesthesia Complications (+) PONV  Airway Mallampati: II  TM Distance: >3 FB Neck ROM: Full    Dental  (+) Dental Advisory Given   Pulmonary asthma , COPD (last inhaler used in march),  COPD inhaler, former smoker,    breath sounds clear to auscultation       Cardiovascular hypertension, Pt. on medications (-) angina Rhythm:Regular Rate:Normal     Neuro/Psych  Headaches, negative psych ROS   GI/Hepatic Neg liver ROS, GERD  Controlled,  Endo/Other  negative endocrine ROS  Renal/GU negative Renal ROS     Musculoskeletal  (+) Arthritis , Rheumatoid disorders,    Abdominal   Peds  Hematology negative hematology ROS (+)   Anesthesia Other Findings   Reproductive/Obstetrics                            Anesthesia Physical Anesthesia Plan  ASA: 3  Anesthesia Plan: General   Post-op Pain Management: Regional block* and Tylenol PO (pre-op)*   Induction: Intravenous  PONV Risk Score and Plan: 4 or greater and Ondansetron, Dexamethasone, Treatment may vary due to age or medical condition and Diphenhydramine  Airway Management Planned: Oral ETT  Additional Equipment: None  Intra-op Plan:   Post-operative Plan: Extubation in OR  Informed Consent: I have reviewed the patients History and Physical, chart, labs and discussed the procedure including the risks, benefits and alternatives for the proposed anesthesia with the patient or authorized representative who has indicated his/her understanding and acceptance.     Dental advisory given  Plan Discussed with: CRNA and Surgeon  Anesthesia Plan Comments: (Plan routine monitors, GETA with interscalene block for post op analgesia)        Anesthesia Quick Evaluation

## 2021-12-23 ENCOUNTER — Encounter (HOSPITAL_COMMUNITY): Payer: Self-pay | Admitting: Specialist

## 2021-12-23 ENCOUNTER — Ambulatory Visit (HOSPITAL_BASED_OUTPATIENT_CLINIC_OR_DEPARTMENT_OTHER): Payer: Medicare Other | Admitting: Anesthesiology

## 2021-12-23 ENCOUNTER — Encounter (HOSPITAL_COMMUNITY)
Admission: RE | Disposition: A | Discharge status: Home / Self Care | Payer: Self-pay | Source: Ambulatory Visit | Attending: Specialist

## 2021-12-23 ENCOUNTER — Other Ambulatory Visit: Payer: Self-pay

## 2021-12-23 ENCOUNTER — Ambulatory Visit (HOSPITAL_COMMUNITY): Payer: Medicare Other | Admitting: Physician Assistant

## 2021-12-23 ENCOUNTER — Ambulatory Visit (HOSPITAL_COMMUNITY)
Admission: RE | Admit: 2021-12-23 | Discharge: 2021-12-23 | Disposition: A | Discharge status: Home / Self Care | Payer: Medicare Other | Source: Ambulatory Visit | Attending: Specialist | Admitting: Specialist

## 2021-12-23 DIAGNOSIS — M19011 Primary osteoarthritis, right shoulder: Secondary | ICD-10-CM | POA: Diagnosis not present

## 2021-12-23 DIAGNOSIS — Z01818 Encounter for other preprocedural examination: Secondary | ICD-10-CM

## 2021-12-23 DIAGNOSIS — R269 Unspecified abnormalities of gait and mobility: Secondary | ICD-10-CM | POA: Diagnosis not present

## 2021-12-23 DIAGNOSIS — I10 Essential (primary) hypertension: Secondary | ICD-10-CM | POA: Insufficient documentation

## 2021-12-23 DIAGNOSIS — J449 Chronic obstructive pulmonary disease, unspecified: Secondary | ICD-10-CM

## 2021-12-23 DIAGNOSIS — X58XXXA Exposure to other specified factors, initial encounter: Secondary | ICD-10-CM | POA: Insufficient documentation

## 2021-12-23 DIAGNOSIS — Y9389 Activity, other specified: Secondary | ICD-10-CM | POA: Insufficient documentation

## 2021-12-23 DIAGNOSIS — Z87891 Personal history of nicotine dependence: Secondary | ICD-10-CM | POA: Insufficient documentation

## 2021-12-23 DIAGNOSIS — M069 Rheumatoid arthritis, unspecified: Secondary | ICD-10-CM | POA: Diagnosis not present

## 2021-12-23 DIAGNOSIS — S46011A Strain of muscle(s) and tendon(s) of the rotator cuff of right shoulder, initial encounter: Secondary | ICD-10-CM

## 2021-12-23 DIAGNOSIS — R519 Headache, unspecified: Secondary | ICD-10-CM | POA: Insufficient documentation

## 2021-12-23 DIAGNOSIS — M7541 Impingement syndrome of right shoulder: Secondary | ICD-10-CM

## 2021-12-23 DIAGNOSIS — K219 Gastro-esophageal reflux disease without esophagitis: Secondary | ICD-10-CM | POA: Insufficient documentation

## 2021-12-23 HISTORY — PX: SHOULDER ARTHROSCOPY WITH ROTATOR CUFF REPAIR AND SUBACROMIAL DECOMPRESSION: SHX5686

## 2021-12-23 SURGERY — SHOULDER ARTHROSCOPY WITH ROTATOR CUFF REPAIR AND SUBACROMIAL DECOMPRESSION
Anesthesia: General | Laterality: Right

## 2021-12-23 MED ORDER — HYDROMORPHONE HCL 2 MG PO TABS: 2.0000 mg | ORAL_TABLET | ORAL | 0 refills | Status: AC | PRN

## 2021-12-23 MED ORDER — OXYCODONE HCL 5 MG/5ML PO SOLN: 5.0000 mg | Freq: Once | ORAL | Status: DC | PRN

## 2021-12-23 MED ORDER — MEPERIDINE HCL 50 MG/ML IJ SOLN: 6.2500 mg | INTRAMUSCULAR | Status: DC | PRN

## 2021-12-23 MED ORDER — RIVAROXABAN 10 MG PO TABS: 10.0000 mg | ORAL_TABLET | Freq: Every day | ORAL | 0 refills | Status: AC

## 2021-12-23 MED ORDER — SODIUM CHLORIDE 0.9 % IR SOLN
Status: DC | PRN
  Administered 2021-12-23 (×2): 3000 mL

## 2021-12-23 MED ORDER — POLYETHYLENE GLYCOL 3350 17 G PO PACK: 17.0000 g | PACK | Freq: Every day | ORAL | 0 refills | Status: AC

## 2021-12-23 MED ORDER — OXYCODONE HCL 5 MG PO TABS: 5.0000 mg | ORAL_TABLET | Freq: Once | ORAL | Status: DC | PRN

## 2021-12-23 MED ORDER — BUPIVACAINE-EPINEPHRINE (PF) 0.5% -1:200000 IJ SOLN
INTRAMUSCULAR | Status: DC | PRN
  Administered 2021-12-23: 10 mL

## 2021-12-23 MED ORDER — DOCUSATE SODIUM 100 MG PO CAPS
100.0000 mg | ORAL_CAPSULE | Freq: Two times a day (BID) | ORAL | 1 refills | Status: AC | PRN
Start: 2021-12-23 — End: ?

## 2021-12-23 MED ORDER — LACTATED RINGERS IV SOLN: INTRAVENOUS | Status: DC

## 2021-12-23 MED ORDER — ONDANSETRON HCL 4 MG/2ML IJ SOLN
INTRAMUSCULAR | Status: AC
  Filled 2021-12-23: qty 2

## 2021-12-23 MED ORDER — ROCURONIUM BROMIDE 10 MG/ML (PF) SYRINGE
PREFILLED_SYRINGE | INTRAVENOUS | Status: DC | PRN
  Administered 2021-12-23: 60 mg via INTRAVENOUS

## 2021-12-23 MED ORDER — ACETAMINOPHEN 500 MG PO TABS
1000.0000 mg | ORAL_TABLET | Freq: Once | ORAL | Status: AC
Start: 2021-12-23 — End: 2021-12-23

## 2021-12-23 MED ORDER — ORAL CARE MOUTH RINSE: 15.0000 mL | Freq: Once | OROMUCOSAL | Status: AC

## 2021-12-23 MED ORDER — FENTANYL CITRATE PF 50 MCG/ML IJ SOSY: 25.0000 ug | PREFILLED_SYRINGE | INTRAMUSCULAR | Status: DC | PRN

## 2021-12-23 MED ORDER — LIDOCAINE HCL (PF) 2 % IJ SOLN
INTRAMUSCULAR | Status: AC
  Filled 2021-12-23: qty 5

## 2021-12-23 MED ORDER — ONDANSETRON HCL 4 MG/2ML IJ SOLN
INTRAMUSCULAR | Status: DC | PRN
  Administered 2021-12-23: 4 mg via INTRAVENOUS

## 2021-12-23 MED ORDER — CEPHALEXIN 500 MG PO CAPS: 500.0000 mg | ORAL_CAPSULE | Freq: Four times a day (QID) | ORAL | 1 refills | Status: AC

## 2021-12-23 MED ORDER — EPINEPHRINE PF 1 MG/ML IJ SOLN
INTRAMUSCULAR | Status: AC
  Filled 2021-12-23: qty 1

## 2021-12-23 MED ORDER — PHENYLEPHRINE HCL-NACL 20-0.9 MG/250ML-% IV SOLN
INTRAVENOUS | Status: DC | PRN
  Administered 2021-12-23: 50 ug/min via INTRAVENOUS

## 2021-12-23 MED ORDER — ASPIRIN 81 MG PO TBEC: 81.0000 mg | DELAYED_RELEASE_TABLET | Freq: Two times a day (BID) | ORAL | 1 refills | Status: DC

## 2021-12-23 MED ORDER — FENTANYL CITRATE (PF) 100 MCG/2ML IJ SOLN
INTRAMUSCULAR | Status: AC
  Filled 2021-12-23: qty 2

## 2021-12-23 MED ORDER — EPINEPHRINE PF 1 MG/ML IJ SOLN
INTRAMUSCULAR | Status: AC
  Filled 2021-12-23: qty 2

## 2021-12-23 MED ORDER — PHENYLEPHRINE 80 MCG/ML (10ML) SYRINGE FOR IV PUSH (FOR BLOOD PRESSURE SUPPORT)
PREFILLED_SYRINGE | INTRAVENOUS | Status: DC | PRN
  Administered 2021-12-23: 160 ug via INTRAVENOUS
  Administered 2021-12-23: 80 ug via INTRAVENOUS
  Administered 2021-12-23 (×2): 160 ug via INTRAVENOUS

## 2021-12-23 MED ORDER — SUGAMMADEX SODIUM 200 MG/2ML IV SOLN
INTRAVENOUS | Status: DC | PRN
  Administered 2021-12-23: 200 mg via INTRAVENOUS

## 2021-12-23 MED ORDER — LIDOCAINE HCL (CARDIAC) PF 100 MG/5ML IV SOSY
PREFILLED_SYRINGE | INTRAVENOUS | Status: DC | PRN
  Administered 2021-12-23: 40 mg via INTRAVENOUS

## 2021-12-23 MED ORDER — CEFAZOLIN SODIUM-DEXTROSE 2-4 GM/100ML-% IV SOLN
INTRAVENOUS | Status: AC
  Filled 2021-12-23: qty 100

## 2021-12-23 MED ORDER — DEXAMETHASONE SODIUM PHOSPHATE 4 MG/ML IJ SOLN
INTRAMUSCULAR | Status: DC | PRN
  Administered 2021-12-23: 6 mg via INTRAVENOUS

## 2021-12-23 MED ORDER — PROPOFOL 10 MG/ML IV BOLUS
INTRAVENOUS | Status: DC | PRN
  Administered 2021-12-23: 90 mg via INTRAVENOUS

## 2021-12-23 MED ORDER — FENTANYL CITRATE (PF) 100 MCG/2ML IJ SOLN
INTRAMUSCULAR | Status: DC | PRN
Start: 2021-12-23 — End: 2021-12-23
  Administered 2021-12-23: 50 ug via INTRAVENOUS

## 2021-12-23 MED ORDER — EPHEDRINE SULFATE-NACL 50-0.9 MG/10ML-% IV SOSY
PREFILLED_SYRINGE | INTRAVENOUS | Status: DC | PRN
  Administered 2021-12-23 (×2): 5 mg via INTRAVENOUS

## 2021-12-23 MED ORDER — BUPIVACAINE-EPINEPHRINE 0.5% -1:200000 IJ SOLN
INTRAMUSCULAR | Status: AC
  Filled 2021-12-23: qty 1

## 2021-12-23 MED ORDER — MIDAZOLAM HCL 2 MG/2ML IJ SOLN: 0.5000 mg | Freq: Once | INTRAMUSCULAR | Status: DC | PRN

## 2021-12-23 MED ORDER — CHLORHEXIDINE GLUCONATE 0.12 % MT SOLN
15.0000 mL | Freq: Once | OROMUCOSAL | Status: AC
  Administered 2021-12-23: 15 mL via OROMUCOSAL

## 2021-12-23 MED ORDER — PHENYLEPHRINE 80 MCG/ML (10ML) SYRINGE FOR IV PUSH (FOR BLOOD PRESSURE SUPPORT)
PREFILLED_SYRINGE | INTRAVENOUS | Status: AC
  Filled 2021-12-23: qty 10

## 2021-12-23 MED ORDER — CEFAZOLIN SODIUM-DEXTROSE 2-4 GM/100ML-% IV SOLN
2.0000 g | INTRAVENOUS | Status: AC
  Administered 2021-12-23: 2 g via INTRAVENOUS

## 2021-12-23 MED ORDER — ROCURONIUM BROMIDE 10 MG/ML (PF) SYRINGE
PREFILLED_SYRINGE | INTRAVENOUS | Status: AC
  Filled 2021-12-23: qty 10

## 2021-12-23 MED ORDER — PHENYLEPHRINE HCL (PRESSORS) 10 MG/ML IV SOLN
INTRAVENOUS | Status: AC
  Filled 2021-12-23: qty 1

## 2021-12-23 MED ORDER — DEXAMETHASONE SODIUM PHOSPHATE 10 MG/ML IJ SOLN
INTRAMUSCULAR | Status: AC
  Filled 2021-12-23: qty 1

## 2021-12-23 MED ORDER — EPINEPHRINE PF 1 MG/ML IJ SOLN
INTRAMUSCULAR | Status: DC | PRN
  Administered 2021-12-23 (×2): 1 mg

## 2021-12-23 MED ORDER — BUPIVACAINE-EPINEPHRINE 0.5% -1:200000 IJ SOLN
INTRAMUSCULAR | Status: DC | PRN
  Administered 2021-12-23: 30 mL

## 2021-12-23 MED ORDER — ACETAMINOPHEN 500 MG PO TABS
ORAL_TABLET | ORAL | Status: AC
  Administered 2021-12-23: 1000 mg via ORAL
  Filled 2021-12-23: qty 2

## 2021-12-23 SURGICAL SUPPLY — 72 items
ANCHOR NDL 9/16 CIR SZ 8 (NEEDLE) IMPLANT
ANCHOR NEEDLE 9/16 CIR SZ 8 (NEEDLE) IMPLANT
ANCHOR SWIVELOCK BIO 4.75X19.1 (Anchor) ×3 IMPLANT
BAG COUNTER SPONGE SURGICOUNT (BAG) ×1 IMPLANT
BLADE EXCALIBUR 4.0X13 (MISCELLANEOUS) ×2 IMPLANT
BLADE SURG SZ11 CARB STEEL (BLADE) ×2 IMPLANT
BURR OVAL 8 FLU 4.0X13 (MISCELLANEOUS) IMPLANT
CANNULA ACUFO 5X76 (CANNULA) ×3 IMPLANT
CLEANER TIP ELECTROSURG 2X2 (MISCELLANEOUS) ×1 IMPLANT
CLSR STERI-STRIP ANTIMIC 1/2X4 (GAUZE/BANDAGES/DRESSINGS) ×1 IMPLANT
COVER SURGICAL LIGHT HANDLE (MISCELLANEOUS) ×2 IMPLANT
DISSECTOR  3.8MM X 13CM (MISCELLANEOUS)
DISSECTOR 3.5MM X 13CM (MISCELLANEOUS) IMPLANT
DISSECTOR 3.8MM X 13CM (MISCELLANEOUS) IMPLANT
DRAPE IMP U-DRAPE 54X76 (DRAPES) ×2 IMPLANT
DRAPE INCISE IOBAN 66X45 STRL (DRAPES) ×2 IMPLANT
DRAPE ORTHO SPLIT 77X108 STRL (DRAPES) ×2
DRAPE POUCH INSTRU U-SHP 10X18 (DRAPES) ×2 IMPLANT
DRAPE STERI 35X30 U-POUCH (DRAPES) ×2 IMPLANT
DRAPE SURG ORHT 6 SPLT 77X108 (DRAPES) ×2 IMPLANT
DRSG AQUACEL AG ADV 3.5X 4 (GAUZE/BANDAGES/DRESSINGS) IMPLANT
DRSG AQUACEL AG ADV 3.5X 6 (GAUZE/BANDAGES/DRESSINGS) ×1 IMPLANT
DRSG PAD ABDOMINAL 8X10 ST (GAUZE/BANDAGES/DRESSINGS) IMPLANT
DURAPREP 26ML APPLICATOR (WOUND CARE) ×2 IMPLANT
DW OUTFLOW CASSETTE/TUBE SET (MISCELLANEOUS) ×2 IMPLANT
ELECT NDL TIP 2.8 STRL (NEEDLE) ×1 IMPLANT
ELECT NEEDLE TIP 2.8 STRL (NEEDLE) ×2 IMPLANT
ELECT REM PT RETURN 15FT ADLT (MISCELLANEOUS) ×2 IMPLANT
FILTER STRAW (MISCELLANEOUS) ×2 IMPLANT
GAUZE PAD ABD 7.5X8 STRL (GAUZE/BANDAGES/DRESSINGS) ×1 IMPLANT
GLOVE BIO SURGEON STRL SZ7 (GLOVE) ×2 IMPLANT
GLOVE BIOGEL PI IND STRL 7.0 (GLOVE) ×1 IMPLANT
GLOVE BIOGEL PI IND STRL 8 (GLOVE) ×1 IMPLANT
GLOVE BIOGEL PI INDICATOR 7.0 (GLOVE) ×2
GLOVE BIOGEL PI INDICATOR 8 (GLOVE) ×1
GLOVE SURG POLYISO LF SZ7.5 (GLOVE) ×4 IMPLANT
GLOVE SURG SS PI 8.0 STRL IVOR (GLOVE) ×4 IMPLANT
GOWN STRL REUS W/ TWL XL LVL3 (GOWN DISPOSABLE) ×2 IMPLANT
GOWN STRL REUS W/TWL XL LVL3 (GOWN DISPOSABLE) ×2
KIT BASIN OR (CUSTOM PROCEDURE TRAY) ×2 IMPLANT
KIT TURNOVER KIT A (KITS) ×1 IMPLANT
MANIFOLD NEPTUNE II (INSTRUMENTS) ×2 IMPLANT
NDL SCORPION MULTI FIRE (NEEDLE) IMPLANT
NDL SPNL 18GX3.5 QUINCKE PK (NEEDLE) ×1 IMPLANT
NEEDLE SCORPION MULTI FIRE (NEEDLE) ×2 IMPLANT
NEEDLE SPNL 18GX3.5 QUINCKE PK (NEEDLE) ×2 IMPLANT
PACK SHOULDER (CUSTOM PROCEDURE TRAY) ×2 IMPLANT
PORT APPOLLO RF 90DEGREE MULTI (SURGICAL WAND) ×3 IMPLANT
PROTECTOR NERVE ULNAR (MISCELLANEOUS) ×2 IMPLANT
RESTRAINT HEAD UNIVERSAL NS (MISCELLANEOUS) ×2 IMPLANT
SLING ARM IMMOBILIZER MED (SOFTGOODS) ×1 IMPLANT
SLING ULTRA II L (ORTHOPEDIC SUPPLIES) IMPLANT
STRIP CLOSURE SKIN 1/2X4 (GAUZE/BANDAGES/DRESSINGS) IMPLANT
SUCTION FRAZIER HANDLE 12FR (TUBING) ×1
SUCTION TUBE FRAZIER 12FR DISP (TUBING) ×1 IMPLANT
SUT ETHIBOND NAB CT1 #1 30IN (SUTURE) IMPLANT
SUT ETHILON 4 0 PS 2 18 (SUTURE) ×2 IMPLANT
SUT FIBERWIRE #2 38 T-5 BLUE (SUTURE)
SUT PROLENE 3 0 PS 2 (SUTURE) ×2 IMPLANT
SUT TIGER TAPE 7 IN WHITE (SUTURE) ×1 IMPLANT
SUT VIC AB 0 CT1 27 (SUTURE) ×1
SUT VIC AB 0 CT1 27XBRD ANTBC (SUTURE) ×1 IMPLANT
SUT VIC AB 1-0 CT2 27 (SUTURE) IMPLANT
SUT VIC AB 2-0 CT2 27 (SUTURE) IMPLANT
SUT VICRYL 0 UR6 27IN ABS (SUTURE) ×2 IMPLANT
SUTURE FIBERWR #2 38 T-5 BLUE (SUTURE) IMPLANT
SYR 20ML LL LF (SYRINGE) ×2 IMPLANT
TAPE FIBER 2MM 7IN #2 BLUE (SUTURE) ×2 IMPLANT
TOWEL OR 17X26 10 PK STRL BLUE (TOWEL DISPOSABLE) ×2 IMPLANT
TUBING ARTHROSCOPY IRRIG 16FT (MISCELLANEOUS) ×2 IMPLANT
TUBING CONNECTING 10 (TUBING) ×4 IMPLANT
WIPE CHG 2% PREP (PERSONAL CARE ITEMS) ×2 IMPLANT

## 2021-12-23 NOTE — Anesthesia Postprocedure Evaluation (Signed)
Anesthesia Post Note  Patient: [name]  Procedure(s) Performed: Revision right shoulder arthroscopy, subacromial decompression, mini open rotator cuff repair and possible patch graft (Right)     Patient location during evaluation: PACU Anesthesia Type: General and Regional Level of consciousness: awake and alert, patient cooperative and oriented Pain management: pain level controlled Vital Signs Assessment: post-procedure vital signs reviewed and stable Respiratory status: spontaneous breathing, nonlabored ventilation and respiratory function stable Cardiovascular status: blood pressure returned to baseline and stable Postop Assessment: no apparent nausea or vomiting Anesthetic complications: no   No notable events documented.  Last Vitals:  Vitals:   12/23/21 1000 12/23/21 1038  BP: (!) 121/55 134/66  Pulse: 67 69  Resp: 20 16  Temp: (!) 36.4 C 36.4 C  SpO2: 96% 96%    Last Pain:  Vitals:   12/23/21 1038  TempSrc: Oral  PainSc: 0-No pain                 Clary Boulais,E. Platon Arocho

## 2021-12-23 NOTE — Transfer of Care (Signed)
Immediate Anesthesia Transfer of Care Note  Patient: [name]  Procedure(s) Performed: Revision right shoulder arthroscopy, subacromial decompression, mini open rotator cuff repair and possible patch graft (Right)  Patient Location: PACU  Anesthesia Type:General and Regional  Level of Consciousness: awake and alert   Airway & Oxygen Therapy: Patient Spontanous Breathing and Patient connected to nasal cannula oxygen  Post-op Assessment: Report given to RN and Post -op Vital signs reviewed and stable  Post vital signs: Reviewed and stable  Last Vitals:  Vitals Value Taken Time  BP 87/71 12/23/21 0924  Temp    Pulse 72 12/23/21 0925  Resp 16 12/23/21 0925  SpO2 100 % 12/23/21 0925  Vitals shown include unvalidated device data.  Last Pain:  Vitals:   12/23/21 0551  TempSrc: Oral  PainSc:       Patients Stated Pain Goal: 4 (12/23/21 0537)  Complications: No notable events documented.

## 2021-12-23 NOTE — Anesthesia Procedure Notes (Signed)
Procedure Name: Intubation Date/Time: 12/23/2021 7:33 AM  Performed by: Caren Macadam, CRNAPre-anesthesia Checklist: Patient identified, Emergency Drugs available, Suction available and Patient being monitored Patient Re-evaluated:Patient Re-evaluated prior to induction Oxygen Delivery Method: Circle system utilized Preoxygenation: Pre-oxygenation with 100% oxygen Induction Type: IV induction Ventilation: Mask ventilation without difficulty Laryngoscope Size: Miller and 2 Grade View: Grade I Tube type: Oral Tube size: 7.0 mm Number of attempts: 1 Airway Equipment and Method: Stylet Placement Confirmation: ETT inserted through vocal cords under direct vision, positive ETCO2 and breath sounds checked- equal and bilateral Secured at: 20 cm Tube secured with: Tape Dental Injury: Teeth and Oropharynx as per pre-operative assessment

## 2021-12-23 NOTE — Interval H&P Note (Signed)
History and Physical Interval Note:  12/23/2021 7:21 AM  [name]  has presented today for surgery, with the diagnosis of Recurrent rotator cuff tear right shoulder.  The various methods of treatment have been discussed with the patient and family. After consideration of risks, benefits and other options for treatment, the patient has consented to  Procedure(s): Revision right shoulder arthroscopy, subacromial decompression, mini open rotator cuff repair and possible patch graft (Right) as a surgical intervention.  The patient's history has been reviewed, patient examined, no change in status, stable for surgery.  I have reviewed the patient's chart and labs.  Questions were answered to the patient's satisfaction.     Javier Docker

## 2021-12-23 NOTE — Discharge Instructions (Signed)

## 2021-12-23 NOTE — Anesthesia Procedure Notes (Signed)
Anesthesia Regional Block: Interscalene brachial plexus block   Pre-Anesthetic Checklist: , timeout performed,  Correct Patient, Correct Site, Correct Laterality,  Correct Procedure, Correct Position, site marked,  Risks and benefits discussed,  Surgical consent,  Pre-op evaluation,  At surgeon's request and post-op pain management  Laterality: Right and Upper  Prep: chloraprep       Needles:  Injection technique: Single-shot  Needle Type: Echogenic Needle     Needle Length: 9cm  Needle Gauge: 21     Additional Needles:   Procedures:,,,, ultrasound used (permanent image in chart),,    Narrative:  Start time: 12/23/2021 6:59 AM End time: 12/23/2021 7:05 AM Injection made incrementally with aspirations every 5 mL.  Performed by: Personally  Anesthesiologist: Jairo Ben, MD  Additional Notes: Pt identified in Holding room.  Monitors applied. Working IV access confirmed. Sterile prep R clavicle and neck.  #21ga ECHOgenic Arrow block needle to interscalene brachial plexus with US guidance.  10cc 0.5% Bupivacaine 1:200k epi, Exparel injected incrementally after negative test dose.  Patient asymptomatic, VSS, no heme aspirated, tolerated well.   Sandford Craze, MD

## 2021-12-23 NOTE — Brief Op Note (Signed)
12/23/2021  9:19 AM  PATIENT:  [name]  71 y.o. female  PRE-OPERATIVE DIAGNOSIS:  Recurrent rotator cuff tear right shoulder  POST-OPERATIVE DIAGNOSIS:  Recurrent rotator cuff tear right shoulder  PROCEDURE:  Procedure(s): Revision right shoulder arthroscopy, subacromial decompression, mini open rotator cuff repair and possible patch graft (Right) DIAGNOSES: Right shoulder, acute traumatic rotator cuff tear recurrent Impingement syndrome  POST-OPERATIVE DIAGNOSIS: same  PROCEDURE:Right shoulder arthroscopy, acromioplasty, CA ligament resection, Debridement subacromial and subdeltoid bursa, supraspinatus tendon,excision of greater tuberosity osteophyte, mini open rotator cuff repair With Arthrex implants.  SURGEON:  Surgeon(s) and Role:    * Jene Every, MD - Primary  PHYSICIAN ASSISTANT:   ASSISTANTS: Bissell   ANESTHESIA:   general  EBL:  min   BLOOD ADMINISTERED:none  DRAINS: none   LOCAL MEDICATIONS USED:  MARCAINE     SPECIMEN:  No Specimen  DISPOSITION OF SPECIMEN:  N/A  COUNTS:  YES  TOURNIQUET:  * No tourniquets in log *  DICTATION: .Other Dictation: Dictation Number   [id number]  PLAN OF CARE: Discharge to home after PACU  PATIENT DISPOSITION:  PACU - hemodynamically stable.   Delay start of Pharmacological VTE agent (>24hrs) due to surgical blood loss or risk of bleeding: no

## 2021-12-24 ENCOUNTER — Encounter (HOSPITAL_COMMUNITY): Payer: Self-pay | Admitting: Specialist

## 2021-12-24 NOTE — Op Note (Signed)
NAME: [name], [name] MEDICAL RECORD NO: [id number] ACCOUNT NO: [id number] DATE OF BIRTH: 1950/07/18 FACILITY: [facility] LOCATION: [facility] PHYSICIAN: Javier Docker, MD  Operative Report   DATE OF PROCEDURE: 12/23/2021  PREOPERATIVE DIAGNOSES:  Recurrent rotator cuff tear of the right shoulder, status post remote open rotator cuff repair, impingement syndrome right shoulder and osteoarthritis right shoulder.  POSTOPERATIVE DIAGNOSES:  Recurrent rotator cuff tear of the right shoulder, status post remote open rotator cuff repair, impingement syndrome right shoulder and osteoarthritis right shoulder.  PROCEDURE PERFORMED:  Right shoulder arthroscopy with subacromial decompression, CA ligament resection, acromioplasty, subdeltoid and subacromial bursectomy, debridement of supraspinatus tendon, mini open rotator cuff repair utilizing 3 SwiveLock suture  anchors.  ANESTHESIA:  General with regional block.  ASSISTANT:  Andrez Grime, PA  HISTORY:  A 71 year old with a remote tear of the rotator cuff, status post open repair at an outside facility.  The patient had recurrent high-grade tear of the supraspinatus, acute.  She was indicated for shoulder arthroscopy with subacromial  decompression, mini open rotator cuff repair.  She also had a previous distal clavicle resection.  She had underlying osteoarthritis of the shoulder noted as well.  We discussed risks and benefits including bleeding, infection, damage to neurovascular  structures, no change in symptoms, worsening symptoms, DVT, PE, anesthetic complications, etc.  TECHNIQUE:  With the patient in supine beach chair position, after induction of adequate general anesthesia, 2 grams Kefzol, the right shoulder and upper extremity was prepped and draped in the usual sterile fashion.  Range of motion was full.  A  surgical marker was utilized to delineate the acromion, AC joint, coracoid.  Previous surgical incision extended anteriorly from  lateral to the anterolateral portion of the acromion to medial to the [facility] joint.  I chose to extend the lateral portion of the  incision approximately 3 cm.  A standard posterolateral portal was fashioned with a #11 blade.  Ingress cannula atraumatically placed.  Irrigant was utilized to insufflate the joint and then with the arm in 70/30 position, we advanced the cannula gently  to the capsule, the glenohumeral joint in line with the coracoid.  This was fairly narrow and tight space.  I elected therefore not to proceed as I felt that would traumatize the arthritic cartilage.  I redirected in the subacromial space.  Insufflated  with 35 mmHg, arthroscopic fluid.  An anterolateral portal was fashioned with #11 blade, just the mid portion of the longitudinal line of the [facility] joint.  We triangulated in the subacromial space.  Noted was hypertrophic bursa throughout.  I introduced a  shaver and performed a full bursectomy, subdeltoid and subacromial.  There was previous fibrosis noted.  This was lysed from the anterior deltoid and the subdeltoid bursa.  A hypertrophic CA ligament was noted.  I introduced Arthrowand and released that,  preserving the deltoid attachment.  A shaver was introduced and utilized to remove a small spur that was on the acromion at the [facility] joint and I debrided some scar tissue just within the joint on the other side of this spur.  Following this, we inspected  the cuff.  There was full-thickness tear noted in the supraspinatus with degenerative fraying of that.  The subscap and the infraspinatus appeared to be intact.  There was increase in the acromiohumeral distance following the release of the CA ligament.   I therefore converted to a mini open repair.  Removed all instrumentation, closed the portals with 4-0 nylon simple sutures.  I  made that incision over the anterolateral aspect of the acromion 3 cm in line with the previous incision.  Subcutaneous tissue  was dissected.   Electrocautery was utilized to achieve hemostasis.  The previous scar tissue was encountered.  We identified a raphe between the anterior and lateral heads of the deltoid.  I used blunt dissection down to the joint, used a self-retaining  retractor.  I entered the subacromial space and evacuated arthroscopic fluid.  Digitally lysed any noted adhesions.  I then identified the supraspinatus full thickness tear just lateral to the biceps tendon approximately 1 cm.  I then excised the  degenerated portion of the tendon, which was approximately 1 x 1.5 cm.  I mobilized the cuff on the bursal and articular side with a Cobb.  We lavaged the glenohumeral joint.  I then fashioned a bed in the greater tuberosity lateral to the articular  surface with a Beyer rongeur with good bleeding tissue.  There was fairly prominent osteophyte off of the greater tuberosity and this was removed with a Beyer rongeur.  I placed one SwiveLock lateral to the articular surface after fashioning a pilot hole  with an awl.  I inserted the SwiveLock, which had two TigerTapes associated with that.  I advanced the SwiveLock fully.  Excellent resistance to pullout.  I used a Scorpion suture passer to pass two leaflets two posterior medial and then two anterior.   These were crossed and then I advanced the cuff.  Good coverage of the tuberosity was noted.  These were crossed and placing the second row of SwiveLocks in a double-row fashion on the lateral aspect just over the tip of the greater tuberosity after  fashioning pilot holes with an awl, inserting the SwiveLocks with the arm in neutral position without undue tension.  Full coverage was noted.  Redundant suture was removed.  Inspection of the cuff revealed a watertight closure.  I had good range noted.   I then copiously irrigated with irrigation.  Closed the raphe with 0 Vicryl interrupted figure-of-eight suture, subcutaneous with 2-0 and skin with Prolene.  Sterile dressing applied,  placed in a sling, extubated, and transported to the recovery room in  satisfactory condition.  The patient tolerated the procedure well.  No complications.  Assistant Andrez Grime, [place], was used throughout the case for patient positioning, retraction to the upper extremity, monitoring inflow and outflow and final closure.   SHW D: 12/23/2021 9:38:35 am T: 12/24/2021 12:30:00 am  JOB: [id number]/ [id number]

## 2021-12-25 ENCOUNTER — Encounter (HOSPITAL_COMMUNITY): Payer: Self-pay | Admitting: Specialist

## 2023-07-21 LAB — COLOGUARD: COLOGUARD: NEGATIVE

## 2023-09-16 ENCOUNTER — Ambulatory Visit (HOSPITAL_COMMUNITY)
Admission: RE | Admit: 2023-09-16 | Discharge: 2023-09-16 | Disposition: A | Discharge status: Home / Self Care | Source: Ambulatory Visit | Attending: Family Medicine | Admitting: Family Medicine

## 2023-09-16 ENCOUNTER — Other Ambulatory Visit (HOSPITAL_COMMUNITY): Payer: Self-pay | Admitting: Family Medicine

## 2023-09-16 DIAGNOSIS — R0602 Shortness of breath: Secondary | ICD-10-CM

## 2023-12-21 ENCOUNTER — Ambulatory Visit: Admitting: Pulmonary Disease

## 2024-02-29 ENCOUNTER — Other Ambulatory Visit (HOSPITAL_COMMUNITY): Payer: Self-pay

## 2024-02-29 DIAGNOSIS — R0609 Other forms of dyspnea: Secondary | ICD-10-CM

## 2024-03-02 ENCOUNTER — Ambulatory Visit (HOSPITAL_COMMUNITY)
Admission: RE | Admit: 2024-03-02 | Discharge: 2024-03-02 | Disposition: A | Discharge status: Home / Self Care | Source: Ambulatory Visit | Attending: Pulmonary Disease | Admitting: Pulmonary Disease

## 2024-03-02 DIAGNOSIS — R0609 Other forms of dyspnea: Secondary | ICD-10-CM | POA: Insufficient documentation

## 2024-03-02 LAB — ECHOCARDIOGRAM COMPLETE
Area-P 1/2: 3.53 cm2
S' Lateral: 2.55 cm

## 2024-03-02 MED ORDER — PERFLUTREN LIPID MICROSPHERE
1.0000 mL | INTRAVENOUS | Status: AC | PRN
  Administered 2024-03-02: 2 mL via INTRAVENOUS
